# Patient Record
Sex: Female | Born: 1960 | Race: White | Hispanic: No | Marital: Married | Smoking: Never smoker
Health system: Southern US, Community
[De-identification: ages and names within clinical notes are randomized; demographics above are authoritative.]

## PROBLEM LIST (undated history)

## (undated) DIAGNOSIS — E119 Type 2 diabetes mellitus without complications: Secondary | ICD-10-CM

## (undated) HISTORY — PX: CARDIAC SURGERY: SHX584

## (undated) HISTORY — PX: KIDNEY STONE SURGERY: SHX686

---

## 2011-02-13 ENCOUNTER — Ambulatory Visit: Payer: Self-pay | Admitting: Internal Medicine

## 2018-10-16 ENCOUNTER — Inpatient Hospital Stay
Admission: EM | Admit: 2018-10-16 | Discharge: 2018-10-18 | DRG: 639 | Disposition: A | Discharge status: Home / Self Care | Payer: Managed Care, Other (non HMO) | Attending: Internal Medicine | Admitting: Internal Medicine

## 2018-10-16 ENCOUNTER — Other Ambulatory Visit: Payer: Self-pay

## 2018-10-16 ENCOUNTER — Encounter: Payer: Self-pay | Admitting: Emergency Medicine

## 2018-10-16 ENCOUNTER — Emergency Department: Payer: Managed Care, Other (non HMO)

## 2018-10-16 DIAGNOSIS — E1310 Other specified diabetes mellitus with ketoacidosis without coma: Secondary | ICD-10-CM

## 2018-10-16 DIAGNOSIS — E875 Hyperkalemia: Secondary | ICD-10-CM | POA: Diagnosis present

## 2018-10-16 DIAGNOSIS — E860 Dehydration: Secondary | ICD-10-CM | POA: Diagnosis present

## 2018-10-16 DIAGNOSIS — E878 Other disorders of electrolyte and fluid balance, not elsewhere classified: Secondary | ICD-10-CM | POA: Diagnosis present

## 2018-10-16 DIAGNOSIS — K529 Noninfective gastroenteritis and colitis, unspecified: Secondary | ICD-10-CM | POA: Diagnosis present

## 2018-10-16 DIAGNOSIS — Z888 Allergy status to other drugs, medicaments and biological substances status: Secondary | ICD-10-CM | POA: Diagnosis not present

## 2018-10-16 DIAGNOSIS — Z794 Long term (current) use of insulin: Secondary | ICD-10-CM | POA: Diagnosis not present

## 2018-10-16 DIAGNOSIS — E1110 Type 2 diabetes mellitus with ketoacidosis without coma: Secondary | ICD-10-CM

## 2018-10-16 DIAGNOSIS — E0810 Diabetes mellitus due to underlying condition with ketoacidosis without coma: Secondary | ICD-10-CM | POA: Diagnosis not present

## 2018-10-16 DIAGNOSIS — R4789 Other speech disturbances: Secondary | ICD-10-CM | POA: Diagnosis not present

## 2018-10-16 HISTORY — DX: Type 2 diabetes mellitus without complications: E11.9

## 2018-10-16 LAB — LACTIC ACID, PLASMA
Lactic Acid, Venous: 2.1 mmol/L (ref 0.5–1.9)
Lactic Acid, Venous: 0.7 mmol/L (ref 0.5–1.9)

## 2018-10-16 LAB — LIPASE, BLOOD: Lipase: 113 U/L — ABNORMAL HIGH (ref 11–51)

## 2018-10-16 LAB — BASIC METABOLIC PANEL
Anion gap: 21 — ABNORMAL HIGH (ref 5–15)
Anion gap: 7 (ref 5–15)
BUN: 28 mg/dL — ABNORMAL HIGH (ref 6–20)
BUN: 19 mg/dL (ref 6–20)
CO2: 8 mmol/L — ABNORMAL LOW (ref 22–32)
CO2: 11 mmol/L — ABNORMAL LOW (ref 22–32)
CREATININE: 0.93 mg/dL (ref 0.44–1.00)
CREATININE: 0.55 mg/dL (ref 0.44–1.00)
Calcium: 8.4 mg/dL — ABNORMAL LOW (ref 8.9–10.3)
Calcium: 7.5 mg/dL — ABNORMAL LOW (ref 8.9–10.3)
Chloride: 107 mmol/L (ref 98–111)
Chloride: 122 mmol/L — ABNORMAL HIGH (ref 98–111)
GFR calc Af Amer: >60 mL/min (ref >60)
GFR calc non Af Amer: >60 mL/min (ref >60)
Glucose, Bld: 488 mg/dL — ABNORMAL HIGH (ref 70–99)
Glucose, Bld: 191 mg/dL — ABNORMAL HIGH (ref 70–99)
Potassium: 5.6 mmol/L — ABNORMAL HIGH (ref 3.5–5.1)
Potassium: 3.8 mmol/L (ref 3.5–5.1)
Sodium: 136 mmol/L (ref 135–145)
Sodium: 140 mmol/L (ref 135–145)

## 2018-10-16 LAB — COMPREHENSIVE METABOLIC PANEL
ALBUMIN: 4.5 g/dL (ref 3.5–5.0)
ALT: 30 U/L (ref 0–44)
AST: 25 U/L (ref 15–41)
Alkaline Phosphatase: 79 U/L (ref 38–126)
Anion gap: 19 — ABNORMAL HIGH (ref 5–15)
BUN: 27 mg/dL — ABNORMAL HIGH (ref 6–20)
CALCIUM: 9.1 mg/dL (ref 8.9–10.3)
CO2: 9 mmol/L — ABNORMAL LOW (ref 22–32)
Chloride: 106 mmol/L (ref 98–111)
Creatinine, Ser: 1.08 mg/dL — ABNORMAL HIGH (ref 0.44–1.00)
GFR calc Af Amer: >60 mL/min (ref >60)
GFR calc non Af Amer: 57 mL/min — ABNORMAL LOW (ref >60)
Glucose, Bld: 497 mg/dL — ABNORMAL HIGH (ref 70–99)
Potassium: 6.7 mmol/L (ref 3.5–5.1)
SODIUM: 134 mmol/L — AB (ref 135–145)
Total Bilirubin: 1.7 mg/dL — ABNORMAL HIGH (ref 0.3–1.2)
Total Protein: 8.6 g/dL — ABNORMAL HIGH (ref 6.5–8.1)

## 2018-10-16 LAB — CBC
HCT: 52 % — ABNORMAL HIGH (ref 36.0–46.0)
Hemoglobin: 17.5 g/dL — ABNORMAL HIGH (ref 12.0–15.0)
MCH: 33 pg (ref 26.0–34.0)
MCHC: 33.7 g/dL (ref 30.0–36.0)
MCV: 97.9 fL (ref 80.0–100.0)
Platelets: 313 10*3/uL (ref 150–400)
RBC: 5.31 MIL/uL — ABNORMAL HIGH (ref 3.87–5.11)
RDW: 12.5 % (ref 11.5–15.5)
WBC: 9.9 10*3/uL (ref 4.0–10.5)
nRBC: 0 % (ref 0.0–0.2)

## 2018-10-16 LAB — GLUCOSE, CAPILLARY
GLUCOSE-CAPILLARY: 148 mg/dL — AB (ref 70–99)
GLUCOSE-CAPILLARY: 282 mg/dL — AB (ref 70–99)
GLUCOSE-CAPILLARY: 435 mg/dL — AB (ref 70–99)
Glucose-Capillary: 408 mg/dL — ABNORMAL HIGH (ref 70–99)
Glucose-Capillary: 367 mg/dL — ABNORMAL HIGH (ref 70–99)
Glucose-Capillary: 339 mg/dL — ABNORMAL HIGH (ref 70–99)
Glucose-Capillary: 183 mg/dL — ABNORMAL HIGH (ref 70–99)
Glucose-Capillary: 198 mg/dL — ABNORMAL HIGH (ref 70–99)
Glucose-Capillary: 193 mg/dL — ABNORMAL HIGH (ref 70–99)

## 2018-10-16 LAB — URINALYSIS, COMPLETE (UACMP) WITH MICROSCOPIC
BILIRUBIN URINE: NEGATIVE
Bacteria, UA: NONE SEEN
Glucose, UA: >=500 mg/dL — AB
Ketones, ur: 80 mg/dL — AB
LEUKOCYTE UA: NEGATIVE
Nitrite: NEGATIVE
Protein, ur: 30 mg/dL — AB
Specific Gravity, Urine: 1.02 (ref 1.005–1.030)
pH: 5 (ref 5.0–8.0)

## 2018-10-16 LAB — URINE DRUG SCREEN, QUALITATIVE (ARMC ONLY)
Amphetamines, Ur Screen: NOT DETECTED
BENZODIAZEPINE, UR SCRN: NOT DETECTED
Barbiturates, Ur Screen: NOT DETECTED
Cannabinoid 50 Ng, Ur ~~LOC~~: NOT DETECTED
Cocaine Metabolite,Ur ~~LOC~~: NOT DETECTED
MDMA (Ecstasy)Ur Screen: NOT DETECTED
Methadone Scn, Ur: NOT DETECTED
Opiate, Ur Screen: NOT DETECTED
Phencyclidine (PCP) Ur S: NOT DETECTED
Tricyclic, Ur Screen: NOT DETECTED

## 2018-10-16 LAB — INFLUENZA PANEL BY PCR (TYPE A & B)
Influenza A By PCR: NEGATIVE
Influenza B By PCR: NEGATIVE

## 2018-10-16 LAB — TROPONIN I: Troponin I: <0.03 ng/mL (ref <0.03)

## 2018-10-16 LAB — PHOSPHORUS: Phosphorus: <1 mg/dL — CL (ref 2.5–4.6)

## 2018-10-16 LAB — MAGNESIUM: Magnesium: 2 mg/dL (ref 1.7–2.4)

## 2018-10-16 LAB — TSH: TSH: 1.368 u[IU]/mL (ref 0.350–4.500)

## 2018-10-16 LAB — MRSA PCR SCREENING: MRSA by PCR: NEGATIVE

## 2018-10-16 LAB — BETA-HYDROXYBUTYRIC ACID: Beta-Hydroxybutyric Acid: >8 mmol/L — ABNORMAL HIGH (ref 0.05–0.27)

## 2018-10-16 MED ORDER — SODIUM CHLORIDE 0.45 % IV SOLN
INTRAVENOUS | Status: DC
  Administered 2018-10-16: 19:00:00 via INTRAVENOUS

## 2018-10-16 MED ORDER — POTASSIUM PHOSPHATES 15 MMOLE/5ML IV SOLN
30.0000 mmol | Freq: Once | INTRAVENOUS | Status: AC
  Administered 2018-10-17: 30 mmol via INTRAVENOUS
  Filled 2018-10-16: qty 10

## 2018-10-16 MED ORDER — DEXTROSE 50 % IV SOLN: 25.0000 mL | INTRAVENOUS | Status: DC | PRN

## 2018-10-16 MED ORDER — SODIUM CHLORIDE 0.9 % IV SOLN
INTRAVENOUS | Status: AC
  Administered 2018-10-16: 20:00:00 via INTRAVENOUS

## 2018-10-16 MED ORDER — POLYETHYLENE GLYCOL 3350 17 G PO PACK: 17.0000 g | PACK | Freq: Every day | ORAL | Status: DC | PRN

## 2018-10-16 MED ORDER — ALBUTEROL SULFATE (2.5 MG/3ML) 0.083% IN NEBU
2.5000 mg | INHALATION_SOLUTION | Freq: Four times a day (QID) | RESPIRATORY_TRACT | Status: DC
  Administered 2018-10-16 – 2018-10-17 (×4): 2.5 mg via RESPIRATORY_TRACT
  Filled 2018-10-16 (×4): qty 3

## 2018-10-16 MED ORDER — PROMETHAZINE HCL 25 MG/ML IJ SOLN
12.5000 mg | Freq: Once | INTRAMUSCULAR | Status: AC
  Administered 2018-10-16: 12.5 mg via INTRAVENOUS
  Filled 2018-10-16: qty 1

## 2018-10-16 MED ORDER — INSULIN REGULAR(HUMAN) IN NACL 100-0.9 UT/100ML-% IV SOLN: INTRAVENOUS | Status: DC

## 2018-10-16 MED ORDER — SODIUM CHLORIDE 0.9 % IV BOLUS
1000.0000 mL | Freq: Once | INTRAVENOUS | Status: AC
  Administered 2018-10-16: 1000 mL via INTRAVENOUS

## 2018-10-16 MED ORDER — ONDANSETRON HCL 4 MG/2ML IJ SOLN
4.0000 mg | Freq: Once | INTRAMUSCULAR | Status: AC | PRN
  Administered 2018-10-16: 4 mg via INTRAVENOUS
  Filled 2018-10-16: qty 2

## 2018-10-16 MED ORDER — ONDANSETRON HCL 4 MG PO TABS
4.0000 mg | ORAL_TABLET | Freq: Four times a day (QID) | ORAL | Status: DC | PRN
  Administered 2018-10-17 – 2018-10-18 (×2): 4 mg via ORAL
  Filled 2018-10-16 (×2): qty 1

## 2018-10-16 MED ORDER — ENOXAPARIN SODIUM 40 MG/0.4ML ~~LOC~~ SOLN
40.0000 mg | SUBCUTANEOUS | Status: DC
  Administered 2018-10-16 – 2018-10-17 (×2): 40 mg via SUBCUTANEOUS
  Filled 2018-10-16 (×2): qty 0.4

## 2018-10-16 MED ORDER — INSULIN ASPART 100 UNIT/ML ~~LOC~~ SOLN
10.0000 [IU] | Freq: Once | SUBCUTANEOUS | Status: AC
  Administered 2018-10-16: 10 [IU] via INTRAVENOUS
  Filled 2018-10-16: qty 1

## 2018-10-16 MED ORDER — INSULIN REGULAR BOLUS VIA INFUSION
0.0000 [IU] | Freq: Three times a day (TID) | INTRAVENOUS | Status: DC
  Filled 2018-10-16: qty 10

## 2018-10-16 MED ORDER — DEXTROSE-NACL 5-0.45 % IV SOLN: INTRAVENOUS | Status: DC

## 2018-10-16 MED ORDER — SODIUM CHLORIDE 0.9% FLUSH: 3.0000 mL | Freq: Once | INTRAVENOUS | Status: DC

## 2018-10-16 MED ORDER — K PHOS MONO-SOD PHOS DI & MONO 155-852-130 MG PO TABS
500.0000 mg | ORAL_TABLET | ORAL | Status: AC
  Administered 2018-10-17: 500 mg via ORAL
  Filled 2018-10-16 (×3): qty 2

## 2018-10-16 MED ORDER — ACETAMINOPHEN 650 MG RE SUPP: 650.0000 mg | Freq: Four times a day (QID) | RECTAL | Status: DC | PRN

## 2018-10-16 MED ORDER — ONDANSETRON HCL 4 MG/2ML IJ SOLN
4.0000 mg | Freq: Four times a day (QID) | INTRAMUSCULAR | Status: DC | PRN
  Administered 2018-10-17: 4 mg via INTRAVENOUS
  Filled 2018-10-16: qty 2

## 2018-10-16 MED ORDER — CALCIUM GLUCONATE-NACL 1-0.675 GM/50ML-% IV SOLN
1.0000 g | Freq: Once | INTRAVENOUS | Status: AC
  Administered 2018-10-16: 1000 mg via INTRAVENOUS
  Filled 2018-10-16: qty 50

## 2018-10-16 MED ORDER — INSULIN REGULAR(HUMAN) IN NACL 100-0.9 UT/100ML-% IV SOLN
INTRAVENOUS | Status: DC
  Administered 2018-10-16: 3.1 [IU]/h via INTRAVENOUS
  Filled 2018-10-16: qty 100

## 2018-10-16 MED ORDER — ACETAMINOPHEN 325 MG PO TABS: 650.0000 mg | ORAL_TABLET | Freq: Four times a day (QID) | ORAL | Status: DC | PRN

## 2018-10-16 MED ORDER — INSULIN GLARGINE 100 UNIT/ML ~~LOC~~ SOLN
10.0000 [IU] | SUBCUTANEOUS | Status: DC
  Administered 2018-10-17: 10 [IU] via SUBCUTANEOUS
  Filled 2018-10-16 (×2): qty 0.1

## 2018-10-16 MED ORDER — INSULIN ASPART 100 UNIT/ML ~~LOC~~ SOLN
0.0000 [IU] | SUBCUTANEOUS | Status: DC
  Administered 2018-10-17 (×2): 7 [IU] via SUBCUTANEOUS
  Filled 2018-10-16: qty 1

## 2018-10-16 MED ORDER — SODIUM CHLORIDE 0.9 % IV SOLN: INTRAVENOUS | Status: DC

## 2018-10-16 MED ORDER — SODIUM CHLORIDE 0.9 % IV SOLN
1.0000 g | Freq: Once | INTRAVENOUS | Status: DC
  Filled 2018-10-16: qty 10

## 2018-10-16 MED ORDER — DEXTROSE-NACL 5-0.45 % IV SOLN
INTRAVENOUS | Status: DC
  Administered 2018-10-16: 21:00:00 via INTRAVENOUS

## 2018-10-16 NOTE — H&P (Addendum)
[facility] - [place] at [facility]   PATIENT NAME: Judith Rogers    MR#:  [id number]  DATE OF BIRTH:  Jan 14, 1961  DATE OF ADMISSION:  10/16/2018  PRIMARY CARE PHYSICIAN: Marguarite Arbour, MD   REQUESTING/REFERRING PHYSICIAN:   CHIEF COMPLAINT:   Chief Complaint  Patient presents with  . Emesis  . Weakness    HISTORY OF PRESENT ILLNESS: Judith Rogers  is a 58 y.o. female with a known history per below presents emergency room with 3-day history of worsening nausea, emesis, fatigue, generalized weakness, not able to eat over the last several days, in the emergency room patient was found to have potassium 6.7, glucose 497, anion gap 19, hemoglobin 17, bicarb 9, patient valuated in the emergency room, multiple family was present, patient states that she did not take her medications for her diabetes due to inability to tolerate p.o., patient is now admitted for acute diabetic ketoacidosis with hyperkalemia, dehydration, emesis.  PAST MEDICAL HISTORY:   Past Medical History:  Diagnosis Date  . Diabetes mellitus without complication (HCC)     PAST SURGICAL HISTORY:  None  SOCIAL HISTORY:  Social History   Tobacco Use  . Smoking status: Not on file  Substance Use Topics  . Alcohol use: Not on file    FAMILY HISTORY:  HTN  DRUG ALLERGIES:  Allergies  Allergen Reactions  . Aspirin Other (See Comments)    Gi bleeding   . Vioxx [Rofecoxib] Other (See Comments)    Gi bleeding    REVIEW OF SYSTEMS:   CONSTITUTIONAL: No fever, fatigue or weakness.  EYES: No blurred or double vision.  EARS, NOSE, AND THROAT: No tinnitus or ear pain.  RESPIRATORY: No cough, shortness of breath, wheezing or hemoptysis.  CARDIOVASCULAR: No chest pain, orthopnea, edema.  GASTROINTESTINAL: No nausea, vomiting, diarrhea or abdominal pain.  GENITOURINARY: No dysuria, hematuria.  ENDOCRINE: No polyuria, nocturia,  HEMATOLOGY: No anemia, easy bruising or bleeding SKIN: No rash or  lesion. MUSCULOSKELETAL: No joint pain or arthritis.   NEUROLOGIC: No tingling, numbness, weakness.  PSYCHIATRY: No anxiety or depression.   MEDICATIONS AT HOME:  Prior to Admission medications   Medication Sig Start Date End Date Taking? Authorizing Provider  enalapril (VASOTEC) 2.5 MG tablet Take 2.5 mg by mouth daily. 05/01/17  Yes [provider]  Insulin Detemir (LEVEMIR FLEXTOUCH) 100 UNIT/ML Pen Inject 28 Units into the skin Nightly. 04/03/17  Yes [provider]  insulin glargine (LANTUS) 100 UNIT/ML injection Inject 28 Units into the skin daily.   Yes [provider]  insulin lispro (HUMALOG) 100 UNIT/ML KwikPen Inject 50 Units into the skin 2 (two) times daily. 12/19/16  Yes [provider]  metFORMIN (GLUCOPHAGE-XR) 500 MG 24 hr tablet Take 500 mg by mouth daily. 09/02/18  Yes [provider]  Semaglutide,0.25 or 0.5MG /DOS, 2 MG/1.5ML SOPN Inject 0.5 mg into the skin every 7 (seven) days. 09/02/18  Yes [provider]      PHYSICAL EXAMINATION:   VITAL SIGNS: Blood pressure 138/82, pulse (!) 115, temperature (!) 97.4 F (36.3 C), temperature source Oral, resp. rate 18, height 5\' 6"  (1.676 m), weight 74.4 kg, SpO2 99 %.  GENERAL:  58 y.o.-year-old patient lying in the bed with no acute distress.  EYES: Pupils equal, round, reactive to light and accommodation. No scleral icterus. Extraocular muscles intact.  HEENT: Head atraumatic, normocephalic. Oropharynx and nasopharynx clear.  NECK:  Supple, no jugular venous distention. No thyroid enlargement, no tenderness.  LUNGS:  Normal breath sounds bilaterally, no wheezing, rales,rhonchi or crepitation. No use of accessory muscles of respiration.  CARDIOVASCULAR: S1, S2 normal. No murmurs, rubs, or gallops.  ABDOMEN: Soft, nontender, nondistended. Bowel sounds present. No organomegaly or mass.  EXTREMITIES: No pedal edema, cyanosis, or clubbing.  NEUROLOGIC: Cranial nerves II through XII  are intact. Muscle strength 5/5 in all extremities. Sensation intact. Gait not checked.  PSYCHIATRIC: The patient is alert and oriented x 3.  SKIN: No obvious rash, lesion, or ulcer.   LABORATORY PANEL:   CBC Recent Labs  Lab 10/16/18 1424  WBC 9.9  HGB 17.5*  HCT 52.0*  PLT 313  MCV 97.9  MCH 33.0  MCHC 33.7  RDW 12.5   ------------------------------------------------------------------------------------------------------------------  Chemistries  Recent Labs  Lab 10/16/18 1424  NA 134*  K 6.7*  CL 106  CO2 9*  GLUCOSE 497*  BUN 27*  CREATININE 1.08*  CALCIUM 9.1  AST 25  ALT 30  ALKPHOS 79  BILITOT 1.7*   ------------------------------------------------------------------------------------------------------------------ estimated creatinine clearance is 58.5 mL/min (A) (by C-G formula based on SCr of 1.08 mg/dL (H)). ------------------------------------------------------------------------------------------------------------------ No results for input(s): TSH, T4TOTAL, T3FREE, THYROIDAB in the last 72 hours.  Invalid input(s): FREET3   Coagulation profile No results for input(s): INR, PROTIME in the last 168 hours. ------------------------------------------------------------------------------------------------------------------- No results for input(s): DDIMER in the last 72 hours. -------------------------------------------------------------------------------------------------------------------  Cardiac Enzymes No results for input(s): CKMB, TROPONINI, MYOGLOBIN in the last 168 hours.  Invalid input(s): CK ------------------------------------------------------------------------------------------------------------------ Invalid input(s): POCBNP  ---------------------------------------------------------------------------------------------------------------  Urinalysis No results found for: COLORURINE, APPEARANCEUR, LABSPEC, PHURINE, GLUCOSEU, HGBUR,  BILIRUBINUR, KETONESUR, PROTEINUR, UROBILINOGEN, NITRITE, LEUKOCYTESUR   RADIOLOGY: No results found.  EKG: Orders placed or performed during the hospital encounter of 10/16/18  . EKG 12-Lead  . EKG 12-Lead  . ED EKG  . ED EKG    IMPRESSION AND PLAN: *Acute diabetic ketoacidosis, mild Admit to stepdown unit on our DKA protocol, IV fluids for rehydration, and continue close medical monitoring  *Chronic type II diabetes mellitus type 2 with hyperglycemia Plan of care as stated above  *Acute nausea/emesis Secondary to #1 Antiemetics PRN  *Acute hyperkalemia Treated with calcium gluconate, sodium bicarb, IV insulin-currently on insulin drip, BMP per protocol, IV fluids, and continue close medical monitoring  All the records are reviewed and case discussed with ED provider. Management plans discussed with the patient, family and they are in agreement.  CODE STATUS:full   TOTAL TIME TAKING CARE OF THIS PATIENT: 45 minutes.    Evelena Asa Mariesha Venturella M.D on 10/16/2018   Between 7am to 6pm - Pager - [phone]  After 6pm go to [email or web address] - password [email or web address]  [facility]  Office  [phone]  CC: Primary care physician; Marguarite Arbour, MD   Note: This dictation was prepared with Dragon dictation along with smaller phrase technology. Any transcriptional errors that result from this process are unintentional.

## 2018-10-16 NOTE — ED Triage Notes (Signed)
Nausea and vomiting x 3 days. States general weakness and multiple syncopal episodes during this time.

## 2018-10-16 NOTE — Progress Notes (Signed)
Family Meeting Note  Advance Directive:yes  Today a meeting took place with the Patient.  Patient is able to participate  The following clinical team members were present during this meeting:MD  The following were discussed:Patient's diagnosis: DKA, Patient's progosis: Unable to determine and Goals for treatment: Full Code  Additional follow-up to be provided: prn  Time spent during discussion:20 minutes  Bertrum Sol, MD

## 2018-10-16 NOTE — ED Provider Notes (Addendum)
[facility] Emergency Department Provider Note  ____________________________________________   I have reviewed the triage vital signs and the nursing notes. Where available I have reviewed prior notes and, if possible and indicated, outside hospital notes.    HISTORY  Chief Complaint Emesis and Weakness    HPI Judith Rogers is a 58 y.o. female with history of diabetes mellitus presents today complaining of feeling weak.  She states that she has had fever, vomiting, and rhinorrhea cough and other flulike symptoms since Wednesday.  She is supposed to take insulin however because she has not been eating she did not take her insulin.  She states her weakness is progressed she has been very lightheaded multiple times felt like she might pass out.  No injury.  No seizure activity.  Has diffuse abdominal discomfort but no focal pain.  She denies melena bright red blood per rectum or hematemesis she denies any diarrhea.  Decreased stooling in the context of this disease.  She has had positive sick contacts and very large flu exposure in her community.  Patient states she did get a flu shot.   Past Medical History:  Diagnosis Date  . Diabetes mellitus without complication (HCC)     There are no active problems to display for this patient.   History reviewed. No pertinent surgical history.  Prior to Admission medications   Not on File    Allergies Aspirin and Vioxx [rofecoxib]  No family history on file.  Social History Social History   Tobacco Use  . Smoking status: Not on file  Substance Use Topics  . Alcohol use: Not on file  . Drug use: Not on file    Review of Systems Constitutional: + fever/chills Eyes: No visual changes. ENT: No sore throat. No stiff neck no neck pain Cardiovascular: Denies chest pain. + cough Respiratory: Denies shortness of breath. + :   no vomiting.  No diarrhea.  No constipation. Genitourinary: Negative for  dysuria. Musculoskeletal: Negative lower extremity swelling Skin: Negative for rash. Neurological: Negative for severe headaches, focal weakness or numbness.   ____________________________________________   PHYSICAL EXAM:  VITAL SIGNS: ED Triage Vitals  Enc Vitals Group     BP 10/16/18 1404 138/82     Pulse Rate 10/16/18 1404 (!) 115     Resp 10/16/18 1404 18     Temp 10/16/18 1404 (!) 97.4 F (36.3 C)     Temp Source 10/16/18 1404 Oral     SpO2 10/16/18 1404 99 %     Weight 10/16/18 1418 164 lb (74.4 kg)     Height 10/16/18 1418 5\' 6"  (1.676 m)     Head Circumference --      Peak Flow --      Pain Score 10/16/18 1418 5     Pain Loc --      Pain Edu? --      Excl. in GC? --     Constitutional: Alert and oriented.  Eyes: Conjunctivae are normal Head: Atraumatic HEENT: + congestion/rhinnorhea. Mucous membranes are moist.  Oropharynx non-erythematous Neck:   Nontender with no meningismus, no masses, no stridor Cardiovascular: Tachycardia noted regular rhythm. Grossly normal heart sounds.  Good peripheral circulation. Respiratory: Normal respiratory effort.  No retractions. Lungs CTAB. Abdominal: Soft and mild diffuse discomfort surgical. No distention. No guarding no rebound Back:  There is no focal tenderness or step off.  there is no midline tenderness there are no lesions noted. there is no CVA tenderness Musculoskeletal: No  lower extremity tenderness, no upper extremity tenderness. No joint effusions, no DVT signs strong distal pulses no edema Neurologic:  Normal speech and language. No gross focal neurologic deficits are appreciated.  Skin:  Skin is warm, dry and intact. No rash noted. Psychiatric: Mood and affect are anxious. Speech and behavior are normal.  ____________________________________________   LABS (all labs ordered are listed, but only abnormal results are displayed)  Labs Reviewed  GLUCOSE, CAPILLARY - Abnormal; Notable for the following components:       Result Value   Glucose-Capillary 435 (*)    All other components within normal limits  LIPASE, BLOOD - Abnormal; Notable for the following components:   Lipase 113 (*)    All other components within normal limits  COMPREHENSIVE METABOLIC PANEL - Abnormal; Notable for the following components:   Sodium 134 (*)    Potassium 6.7 (*)    CO2 9 (*)    Glucose, Bld 497 (*)    BUN 27 (*)    Creatinine, Ser 1.08 (*)    Total Protein 8.6 (*)    Total Bilirubin 1.7 (*)    GFR calc non Af Amer 57 (*)    Anion gap 19 (*)    All other components within normal limits  CBC - Abnormal; Notable for the following components:   RBC 5.31 (*)    Hemoglobin 17.5 (*)    HCT 52.0 (*)    All other components within normal limits  URINALYSIS, COMPLETE (UACMP) WITH MICROSCOPIC  BASIC METABOLIC PANEL  BETA-HYDROXYBUTYRIC ACID  INFLUENZA PANEL BY PCR (TYPE A & B)    Pertinent labs  results that were available during my care of the patient were reviewed by me and considered in my medical decision making (see chart for details). ____________________________________________  EKG  I personally interpreted any EKGs ordered by me or triage Minus tach rate 121 no acute ST elevation or depression nonspecific ST changes no peak T waves necessarily noted, ____________________________________________  RADIOLOGY  Pertinent labs & imaging results that were available during my care of the patient were reviewed by me and considered in my medical decision making (see chart for details). If possible, patient and/or family made aware of any abnormal findings.  No results found. ____________________________________________    PROCEDURES  Procedure(s) performed: None  Procedures  Critical Care performed: CRITICAL CARE Performed by: Jeanmarie PlantJAMES A    Total critical care time: 36 minutes  Critical care time was exclusive of separately billable procedures and treating other patients.  Critical care was  necessary to treat or prevent imminent or life-threatening deterioration.  Critical care was time spent personally by me on the following activities: development of treatment plan with patient and/or surrogate as well as nursing, discussions with consultants, evaluation of patient's response to treatment, examination of patient, obtaining history from patient or surrogate, ordering and performing treatments and interventions, ordering and review of laboratory studies, ordering and review of radiographic studies, pulse oximetry and re-evaluation of patient's condition.   ____________________________________________   INITIAL IMPRESSION / ASSESSMENT AND PLAN / ED COURSE  Pertinent labs & imaging results that were available during my care of the patient were reviewed by me and considered in my medical decision making (see chart for details).  Patient here with nausea vomiting and fever URI symptoms and flu symptoms.  Abdomen is a little tender but nonsurgical but after 4 days I take this to be very positive sign.  She has evidence however of DKA with an anion gap  and elevated sugars after not taking her sling during the course of this illness.  Her last visit to her doctor early January was a visit for "uncontrolled type 2 diabetes".  Her hemoglobin A1c was 9.1 when last checked before that visit.  Patient however does have an anion gap.  Her potassium is elevated here, but her renal function is good, I am concerned about this but I do not see any acute EKG changes, we will give her insulin to start removing potassium from the bloodstream and we will give her calcium to stabilize her myocardium however I am very reluctant to be aggressive with potassium reduction because if this is DKA which appears to be her whole body potassium is likely depleted and we could cause her some serious trouble if we are too aggressive.  Given that I do not see any significant EKG changes we will take this stepwise approach I  am giving her 2 L of fluid, I am also rechecking her potassium because of the fact that her kidney function is good and that is a pretty high potassium.   ____________________________________________   FINAL CLINICAL IMPRESSION(S) / ED DIAGNOSES  Final diagnoses:  None      This chart was dictated using voice recognition software.  Despite best efforts to proofread,  errors can occur which can change meaning.       Jeanmarie Plant, MD 10/16/18 1521    Jeanmarie Plant, MD 11/12/18 [phone]

## 2018-10-16 NOTE — Consult Note (Signed)
PULMONARY / CRITICAL CARE MEDICINE  Name: Judith Rogers I Dames MRN: [id number] DOB: 11/22/1960    LOS: 0  Referring Provider: Dr. Katheren ShamsSalary Reason for Referral: DKA, intractable nausea and vomiting  HPI: 58 year old female with a history of diabetes admitted with complaints of nausea, vomiting, fever, cough, chills and generalized weakness that started on Wednesday.  States that symptoms progressively got worse hence she was not able to eat and keep down any food and hence did not take her insulin.  At the ED, she was found to be in severe hypoglycemia with a blood glucose of 497 mg/dL, potassium of 6.7 and BUN of 27.  She was started on an insulin infusion and admitted to the ICU.  She reports some improvement with IV fluids and insulin.  She denies chest pain, palpitations, but reports dizziness and headache.  Past Medical History:  Diagnosis Date  . Diabetes mellitus without complication (HCC)    History reviewed. No pertinent surgical history. Prior to Admission medications   Medication Sig Start Date End Date Taking? Authorizing Provider  amLODipine (NORVASC) 5 MG tablet Take 5 mg by mouth daily.   Yes [provider]  clopidogrel (PLAVIX) 75 MG tablet Take 75 mg by mouth daily.   Yes [provider]  donepezil (ARICEPT) 5 MG tablet Take 1 tablet (5 mg total) by mouth at bedtime. 04/26/18 06/05/18 Yes Sowles, Danna HeftyKrichna, MD  empagliflozin (JARDIANCE) 25 MG TABS tablet Take 25 mg by mouth daily.   Yes [provider]  glycopyrrolate (ROBINUL) 1 MG tablet Take 1 mg by mouth 2 (two) times daily.   Yes [provider]  insulin aspart (NOVOLOG FLEXPEN) 100 UNIT/ML FlexPen Inject 12 Units into the skin 2 (two) times daily.   Yes [provider]  insulin aspart (NOVOLOG) 100 UNIT/ML FlexPen Inject 18 Units into the skin daily. At 1700   Yes [provider]  Insulin Degludec-Liraglutide (XULTOPHY) 100-3.6 UNIT-MG/ML SOPN Inject 50 Units into the skin  daily.   Yes [provider]  levETIRAcetam (KEPPRA) 500 MG tablet Take 500 mg by mouth 2 (two) times daily.   Yes [provider]  lipase/protease/amylase (CREON) 12000 units CPEP capsule Take 6,000 Units by mouth 3 (three) times daily before meals.   Yes [provider]  lipase/protease/amylase (CREON) 12000 units CPEP capsule Take 3,000 Units by mouth at bedtime. With snack   Yes [provider]  lisinopril (PRINIVIL,ZESTRIL) 5 MG tablet Take 5 mg by mouth daily.   Yes [provider]  metoprolol succinate (TOPROL-XL) 25 MG 24 hr tablet Take 1 tablet (25 mg total) by mouth daily. 04/26/18  Yes Sowles, Danna HeftyKrichna, MD  rosuvastatin (CRESTOR) 40 MG tablet Take 1 tablet (40 mg total) by mouth daily. 04/26/18 06/05/18 Yes Alba CorySowles, Krichna, MD  aspirin EC 81 MG tablet Take 81 mg by mouth daily.    [provider]  famotidine (PEPCID) 20 MG tablet Take 1 tablet (20 mg total) by mouth 2 (two) times daily. 04/26/18 05/26/18  Alba CorySowles, Krichna, MD  gabapentin (NEURONTIN) 300 MG capsule Take 1 capsule (300 mg total) by mouth 2 (two) times daily. 04/26/18 05/26/18  Alba CorySowles, Krichna, MD  insulin glargine (LANTUS) 100 UNIT/ML injection Inject 0.1 mLs (10 Units total) into the skin daily. 04/26/18 05/26/18  Alba CorySowles, Krichna, MD  lacosamide 100 MG TABS Take 1 tablet (100 mg total) by mouth 2 (two) times daily. Patient not taking: Reported on 06/05/2018 09/11/17   Enedina FinnerPatel, Sona, MD  promethazine (PHENERGAN) 12.5 MG tablet  Take 1 tablet (12.5 mg total) by mouth every 6 (six) hours as needed for nausea or vomiting. Patient not taking: Reported on 06/05/2018 06/30/17   Almond Lint, MD  sertraline (ZOLOFT) 25 MG tablet Take 1 tablet (25 mg total) by mouth daily. Patient not taking: Reported on 06/05/2018 04/26/18   Alba Cory, MD   Allergies Allergies  Allergen Reactions  . Aspirin Other (See Comments)    Gi bleeding   . Vioxx [Rofecoxib] Other (See Comments)    Gi bleeding     Family History No family history on file. Social History  has no history on file for tobacco, alcohol, and drug.  Review Of Systems: All systems reviewed.  Pertinent positives include generalized malaise, abdominal pain, nausea, vomiting, dizziness and headache.  All other systems are negative  VITAL SIGNS: BP 136/63   Pulse 94   Temp 98.4 F (36.9 C) (Oral)   Resp (!) 22   Ht 5\' 6"  (1.676 m)   Wt 71.6 kg   SpO2 98%   BMI 25.48 kg/m   HEMODYNAMICS:    VENTILATOR SETTINGS:    INTAKE / OUTPUT: No intake/output data recorded.  PHYSICAL EXAMINATION: General: Acutely ill looking HEENT: PERRLA Neuro: Alert and oriented x3, no focal deficits Cardiovascular: RRR, S1-S2, no murmur regurg or gallop Lungs: Clear to auscultation bilaterally Abdomen: Distended, positive bowel sounds, diffuse tenderness with gentle palpation Musculoskeletal: Positive range of motion Skin: Warm and dry  LABS:  BMET Recent Labs  Lab 10/16/18 1424 10/16/18 1517  NA 134* 136  K 6.7* 5.6*  CL 106 107  CO2 9* 8*  BUN 27* 28*  CREATININE 1.08* 0.93  GLUCOSE 497* 488*    Electrolytes Recent Labs  Lab 10/16/18 1424 10/16/18 1517  CALCIUM 9.1 8.4*    CBC Recent Labs  Lab 10/16/18 1424  WBC 9.9  HGB 17.5*  HCT 52.0*  PLT 313    Coag's No results for input(s): APTT, INR in the last 168 hours.  Sepsis Markers Recent Labs  Lab 10/16/18 1902  LATICACIDVEN 2.1*    ABG No results for input(s): PHART, PCO2ART, PO2ART in the last 168 hours.  Liver Enzymes Recent Labs  Lab 10/16/18 1424  AST 25  ALT 30  ALKPHOS 79  BILITOT 1.7*  ALBUMIN 4.5    Cardiac Enzymes No results for input(s): TROPONINI, PROBNP in the last 168 hours.  Glucose Recent Labs  Lab 10/16/18 1612 10/16/18 1713 10/16/18 1825 10/16/18 1925 10/16/18 2031 10/16/18 2129  GLUCAP 408* 367* 339* 282* 198* 183*    Imaging Dg Abdomen Acute W/chest  Result Date: 10/16/2018 CLINICAL DATA:   Vomiting, nausea, syncopal episodes, symptoms for 3 days, history diabetes mellitus EXAM: DG ABDOMEN ACUTE W/ 1V CHEST COMPARISON:  None FINDINGS: Normal heart size, mediastinal contours, and pulmonary vascularity. Surgical clips at inferior mediastinum. Emphysematous changes without pulmonary infiltrate, pleural effusion or pneumothorax. Bones demineralized. Bowel gas pattern normal. No bowel dilatation, bowel wall thickening, or free air. No urinary tract calcification. IMPRESSION: Emphysematous changes without infiltrate. No acute abdominal findings. Electronically Signed   By: Ulyses Southward M.D.   On: 10/16/2018 16:14   ASSESSMENT  DKA Hyperkalemia Nausea and vomiting Type 2 diabetes Acute renal failure  PLAN DKA protocol Monitor and correct electrolyte imbalances Zofran and Phenergan for nausea and vomiting Consult to diabetes educator Hemoglobin A1c with a.m. labs Trend creatinine and potassium level  Best Practice: Code Status: Full code Diet: Clear liquids GI prophylaxis: Not indicated VTE prophylaxis: SCDs and  Lovenox  FAMILY  - Updates: Patient updated on current treatment plan at bedside. no family at bedside.  Will update when available  Dalylah Ramey S. Tukov-Yual, ANP-BC Pulmonary and Critical Care Medicine [facility] Pager [phone] or [phone]  NB: This document was prepared using Dragon voice recognition software and may include unintentional dictation errors.    10/16/2018, 9:42 PM

## 2018-10-16 NOTE — Progress Notes (Deleted)
Patient now requesting TPA given CT Angie of the head/neck findings and worsening problems with speech.  Will transfer to stepdown unit for further evaluation.

## 2018-10-17 LAB — BASIC METABOLIC PANEL
ANION GAP: 4 — AB (ref 5–15)
ANION GAP: 5 (ref 5–15)
Anion gap: 7 (ref 5–15)
BUN: 19 mg/dL (ref 6–20)
BUN: 18 mg/dL (ref 6–20)
BUN: 15 mg/dL (ref 6–20)
CALCIUM: 7.5 mg/dL — AB (ref 8.9–10.3)
CALCIUM: 7.6 mg/dL — AB (ref 8.9–10.3)
CALCIUM: 8 mg/dL — AB (ref 8.9–10.3)
CO2: 15 mmol/L — ABNORMAL LOW (ref 22–32)
CO2: 16 mmol/L — ABNORMAL LOW (ref 22–32)
CO2: 17 mmol/L — ABNORMAL LOW (ref 22–32)
CREATININE: 0.58 mg/dL (ref 0.44–1.00)
Chloride: 118 mmol/L — ABNORMAL HIGH (ref 98–111)
Chloride: 115 mmol/L — ABNORMAL HIGH (ref 98–111)
Chloride: 113 mmol/L — ABNORMAL HIGH (ref 98–111)
Creatinine, Ser: 0.54 mg/dL (ref 0.44–1.00)
Creatinine, Ser: 0.53 mg/dL (ref 0.44–1.00)
GFR calc Af Amer: >60 mL/min (ref >60)
GFR calc Af Amer: >60 mL/min (ref >60)
GFR calc Af Amer: >60 mL/min (ref >60)
GFR calc non Af Amer: >60 mL/min (ref >60)
GFR calc non Af Amer: >60 mL/min (ref >60)
GFR calc non Af Amer: >60 mL/min (ref >60)
Glucose, Bld: 169 mg/dL — ABNORMAL HIGH (ref 70–99)
Glucose, Bld: 228 mg/dL — ABNORMAL HIGH (ref 70–99)
Glucose, Bld: 249 mg/dL — ABNORMAL HIGH (ref 70–99)
Potassium: 3.9 mmol/L (ref 3.5–5.1)
Potassium: 3.7 mmol/L (ref 3.5–5.1)
Potassium: 3.3 mmol/L — ABNORMAL LOW (ref 3.5–5.1)
Sodium: 137 mmol/L (ref 135–145)
Sodium: 136 mmol/L (ref 135–145)
Sodium: 137 mmol/L (ref 135–145)

## 2018-10-17 LAB — CBC WITH DIFFERENTIAL/PLATELET
ABS IMMATURE GRANULOCYTES: 0.03 10*3/uL (ref 0.00–0.07)
Basophils Absolute: 0 10*3/uL (ref 0.0–0.1)
Basophils Relative: 0 %
Eosinophils Absolute: 0 10*3/uL (ref 0.0–0.5)
Eosinophils Relative: 0 %
HCT: 35.6 % — ABNORMAL LOW (ref 36.0–46.0)
Hemoglobin: 12.4 g/dL (ref 12.0–15.0)
Immature Granulocytes: 0 %
Lymphocytes Relative: 26 %
Lymphs Abs: 2.1 10*3/uL (ref 0.7–4.0)
MCH: 33 pg (ref 26.0–34.0)
MCHC: 34.8 g/dL (ref 30.0–36.0)
MCV: 94.7 fL (ref 80.0–100.0)
Monocytes Absolute: 0.7 10*3/uL (ref 0.1–1.0)
Monocytes Relative: 8 %
NEUTROS ABS: 5 10*3/uL (ref 1.7–7.7)
NRBC: 0 % (ref 0.0–0.2)
Neutrophils Relative %: 66 %
Platelets: 231 10*3/uL (ref 150–400)
RBC: 3.76 MIL/uL — ABNORMAL LOW (ref 3.87–5.11)
RDW: 12.6 % (ref 11.5–15.5)
WBC: 7.8 10*3/uL (ref 4.0–10.5)

## 2018-10-17 LAB — GLUCOSE, CAPILLARY
GLUCOSE-CAPILLARY: 144 mg/dL — AB (ref 70–99)
GLUCOSE-CAPILLARY: 264 mg/dL — AB (ref 70–99)
GLUCOSE-CAPILLARY: 281 mg/dL — AB (ref 70–99)
Glucose-Capillary: 142 mg/dL — ABNORMAL HIGH (ref 70–99)
Glucose-Capillary: 156 mg/dL — ABNORMAL HIGH (ref 70–99)
Glucose-Capillary: 220 mg/dL — ABNORMAL HIGH (ref 70–99)
Glucose-Capillary: 223 mg/dL — ABNORMAL HIGH (ref 70–99)
Glucose-Capillary: 242 mg/dL — ABNORMAL HIGH (ref 70–99)

## 2018-10-17 LAB — TROPONIN I
Troponin I: <0.03 ng/mL (ref <0.03)
Troponin I: <0.03 ng/mL (ref <0.03)

## 2018-10-17 LAB — PHOSPHORUS: Phosphorus: 1 mg/dL — CL (ref 2.5–4.6)

## 2018-10-17 LAB — LIPASE, BLOOD: Lipase: 78 U/L — ABNORMAL HIGH (ref 11–51)

## 2018-10-17 MED ORDER — ALBUTEROL SULFATE (2.5 MG/3ML) 0.083% IN NEBU: 2.5000 mg | INHALATION_SOLUTION | Freq: Four times a day (QID) | RESPIRATORY_TRACT | Status: DC | PRN

## 2018-10-17 MED ORDER — NON FORMULARY: 0.5000 mg | Freq: Once | Status: DC

## 2018-10-17 MED ORDER — POTASSIUM CHLORIDE IN NACL 20-0.45 MEQ/L-% IV SOLN
INTRAVENOUS | Status: DC
  Administered 2018-10-17: 09:00:00 via INTRAVENOUS
  Filled 2018-10-17 (×2): qty 1000

## 2018-10-17 MED ORDER — INSULIN ASPART 100 UNIT/ML ~~LOC~~ SOLN
0.0000 [IU] | Freq: Every day | SUBCUTANEOUS | Status: DC
  Administered 2018-10-17: 2 [IU] via SUBCUTANEOUS
  Filled 2018-10-17: qty 1

## 2018-10-17 MED ORDER — K PHOS MONO-SOD PHOS DI & MONO 155-852-130 MG PO TABS
500.0000 mg | ORAL_TABLET | ORAL | Status: AC
  Administered 2018-10-17 (×3): 500 mg via ORAL
  Filled 2018-10-17 (×3): qty 2

## 2018-10-17 MED ORDER — METOCLOPRAMIDE HCL 5 MG/ML IJ SOLN
5.0000 mg | Freq: Four times a day (QID) | INTRAMUSCULAR | Status: AC
  Administered 2018-10-17 – 2018-10-18 (×4): 5 mg via INTRAVENOUS
  Filled 2018-10-17 (×4): qty 2

## 2018-10-17 MED ORDER — INSULIN GLARGINE 100 UNIT/ML ~~LOC~~ SOLN
25.0000 [IU] | Freq: Every day | SUBCUTANEOUS | Status: DC
  Administered 2018-10-17: 25 [IU] via SUBCUTANEOUS
  Filled 2018-10-17 (×2): qty 0.25

## 2018-10-17 MED ORDER — INSULIN GLARGINE 100 UNIT/ML ~~LOC~~ SOLN: 20.0000 [IU] | Freq: Every day | SUBCUTANEOUS | Status: DC

## 2018-10-17 MED ORDER — SEMAGLUTIDE(0.25 OR 0.5MG/DOS) 2 MG/1.5ML ~~LOC~~ SOPN
0.5000 mg | PEN_INJECTOR | Freq: Once | SUBCUTANEOUS | Status: AC
  Administered 2018-10-17: 0.5 mg via SUBCUTANEOUS
  Filled 2018-10-17: qty 1

## 2018-10-17 MED ORDER — INSULIN ASPART 100 UNIT/ML ~~LOC~~ SOLN
0.0000 [IU] | Freq: Three times a day (TID) | SUBCUTANEOUS | Status: DC
  Administered 2018-10-17 – 2018-10-18 (×3): 8 [IU] via SUBCUTANEOUS
  Filled 2018-10-17 (×3): qty 1

## 2018-10-17 MED ORDER — INSULIN GLARGINE 100 UNIT/ML ~~LOC~~ SOLN
10.0000 [IU] | Freq: Once | SUBCUTANEOUS | Status: AC
  Administered 2018-10-17: 10 [IU] via SUBCUTANEOUS
  Filled 2018-10-17: qty 0.1

## 2018-10-17 MED ORDER — LACTATED RINGERS IV SOLN
INTRAVENOUS | Status: DC
  Administered 2018-10-17: via INTRAVENOUS

## 2018-10-17 MED ORDER — POTASSIUM CHLORIDE CRYS ER 20 MEQ PO TBCR
40.0000 meq | EXTENDED_RELEASE_TABLET | Freq: Once | ORAL | Status: AC
  Administered 2018-10-17: 40 meq via ORAL
  Filled 2018-10-17: qty 2

## 2018-10-17 NOTE — Progress Notes (Signed)
Lab reported critical phosphorous level, 1.0. MD notified. Order obtained to reinstate PO replacement X 3 doses. Order entered.

## 2018-10-17 NOTE — Progress Notes (Signed)
[facility] - [place] at [facility]   PATIENT NAME: Judith Rogers    MR#:  [id number]  DATE OF BIRTH:  31-Jul-1961  SUBJECTIVE:  CHIEF COMPLAINT:   Chief Complaint  Patient presents with  . Emesis  . Weakness   Still has nausea but no vomiting. BS improved Off insulin drip and transferred out of ICU  REVIEW OF SYSTEMS:    Review of Systems  Constitutional: Positive for malaise/fatigue. Negative for chills and fever.  HENT: Negative for sore throat.   Eyes: Negative for blurred vision, double vision and pain.  Respiratory: Negative for cough, hemoptysis, shortness of breath and wheezing.   Cardiovascular: Negative for chest pain, palpitations, orthopnea and leg swelling.  Gastrointestinal: Positive for nausea. Negative for abdominal pain, constipation, diarrhea, heartburn and vomiting.  Genitourinary: Negative for dysuria and hematuria.  Musculoskeletal: Negative for back pain and joint pain.  Skin: Negative for rash.  Neurological: Negative for sensory change, speech change, focal weakness and headaches.  Endo/Heme/Allergies: Does not bruise/bleed easily.  Psychiatric/Behavioral: Negative for depression. The patient is not nervous/anxious.     DRUG ALLERGIES:   Allergies  Allergen Reactions  . Aspirin Other (See Comments)    Gi bleeding   . Vioxx [Rofecoxib] Other (See Comments)    Gi bleeding    VITALS:  Blood pressure (!) 153/68, pulse 86, temperature 97.9 F (36.6 C), temperature source Oral, resp. rate 17, height 5\' 6"  (1.676 m), weight 71.6 kg, SpO2 96 %.  PHYSICAL EXAMINATION:   Physical Exam  GENERAL:  58 y.o.-year-old patient lying in the bed with no acute distress.  EYES: Pupils equal, round, reactive to light and accommodation. No scleral icterus. Extraocular muscles intact.  HEENT: Head atraumatic, normocephalic. Oropharynx and nasopharynx clear.  NECK:  Supple, no jugular venous distention. No thyroid enlargement, no tenderness.   LUNGS: Normal breath sounds bilaterally, no wheezing, rales, rhonchi. No use of accessory muscles of respiration.  CARDIOVASCULAR: S1, S2 normal. No murmurs, rubs, or gallops.  ABDOMEN: Soft, nontender, nondistended. Bowel sounds present. No organomegaly or mass.  EXTREMITIES: No cyanosis, clubbing or edema b/l.    NEUROLOGIC: Cranial nerves II through XII are intact. No focal Motor or sensory deficits b/l.   PSYCHIATRIC: The patient is alert and oriented x 3.  SKIN: No obvious rash, lesion, or ulcer.   LABORATORY PANEL:   CBC Recent Labs  Lab 10/16/18 1424  WBC 9.9  HGB 17.5*  HCT 52.0*  PLT 313   ------------------------------------------------------------------------------------------------------------------ Chemistries  Recent Labs  Lab 10/16/18 1424  10/16/18 2229  10/17/18 0651  NA 134*   < > 140   < > 136  K 6.7*   < > 3.8   < > 3.7  CL 106   < > 122*   < > 115*  CO2 9*   < > 11*   < > 16*  GLUCOSE 497*   < > 191*   < > 228*  BUN 27*   < > 19   < > 18  CREATININE 1.08*   < > 0.55   < > 0.53  CALCIUM 9.1   < > 7.5*   < > 7.6*  MG  --   --  2.0  --   --   AST 25  --   --   --   --   ALT 30  --   --   --   --   ALKPHOS 79  --   --   --   --  BILITOT 1.7*  --   --   --   --    < > = values in this interval not displayed.   ------------------------------------------------------------------------------------------------------------------  Cardiac Enzymes Recent Labs  Lab 10/17/18 0651  TROPONINI <0.03   ------------------------------------------------------------------------------------------------------------------  RADIOLOGY:  Dg Abdomen Acute W/chest  Result Date: 10/16/2018 CLINICAL DATA:  Vomiting, nausea, syncopal episodes, symptoms for 3 days, history diabetes mellitus EXAM: DG ABDOMEN ACUTE W/ 1V CHEST COMPARISON:  None FINDINGS: Normal heart size, mediastinal contours, and pulmonary vascularity. Surgical clips at inferior mediastinum. Emphysematous  changes without pulmonary infiltrate, pleural effusion or pneumothorax. Bones demineralized. Bowel gas pattern normal. No bowel dilatation, bowel wall thickening, or free air. No urinary tract calcification. IMPRESSION: Emphysematous changes without infiltrate. No acute abdominal findings. Electronically Signed   By: Ulyses Southward M.D.   On: 10/16/2018 16:14     ASSESSMENT AND PLAN:   * DKA-improved.  Insulin drip stopped in ICU and transferred to medical floor.  Patient continues to have low bicarb.  Blood sugars have continued to be elevated.  I am concerned patient could go into DKA again.  Will start IV fluids.  Stat dose of additional Lantus.  Will repeat BMP.  Increase nighttime Lantus dose.  Sliding scale insulin.  *Nausea and vomiting.  Likely gastritis.  Repeat lipase level.  No further vomiting.  Improving.  Nausea persistent.  Add scheduled Reglan.  *Acute hyperkalemia Improved with IV insulin and treatment.  DVT prophylaxis with Lovenox  All the records are reviewed and case discussed with Care Management/Social Worker Management plans discussed with the patient, family and they are in agreement.  CODE STATUS: FULL CODE  DVT Prophylaxis: SCDs  TOTAL CRITICAL CARE TIME TAKING CARE OF THIS PATIENT: 35 minutes.   POSSIBLE D/C IN 1-2 DAYS, DEPENDING ON CLINICAL CONDITION.  Molinda Bailiff Santez Woodcox M.D on 10/17/2018 at 11:04 AM  Between 7am to 6pm - Pager - [phone]  After 6pm go to [email or web address] - password [email or web address] [facility]  [facility] [place] [facility]  Office  [phone]  CC: Primary care physician; Marguarite Arbour, MD  Note: This dictation was prepared with Dragon dictation along with smaller phrase technology. Any transcriptional errors that result from this process are unintentional.

## 2018-10-17 NOTE — Progress Notes (Signed)
Inpatient Diabetes Program Recommendations  AACE/ADA: New Consensus Statement on Inpatient Glycemic Control (2015)  Target Ranges:  Prepandial:   less than 140 mg/dL      Peak postprandial:   less than 180 mg/dL (1-2 hours)      Critically ill patients:  140 - 180 mg/dL   Lab Results  Component Value Date   GLUCAP 264 (H) 10/17/2018    Review of Glycemic Control  Diabetes history: DM2 Outpatient Diabetes medications: Lantus 28 units qd + Humalog correction scale 8-14 units tid with meals + Metformin 500 mg qd + Ozempic q week Current orders for Inpatient glycemic control: Lantus 25 units qd + Novolog moderate correction tid + hs  Inpatient Diabetes Program Recommendations:   Spoke with patient by phone and reviewed and clarified what patient is currently taking for her diabetes. Patient states she has been on the Ozempic x 1 month. Patient states she feels the Lantus is now helping her blood glucose very much and feels that it is causing her to gain weight. Reveiwed with patient Lantus does not cover meals but rather hrly insulin needs throughout the day. Consider Novolog 5 units tid meal coverage if patient eats 50% meals.  Thank you, Billy Fischer. Qusay Villada, RN, MSN, CDE  Diabetes Coordinator Inpatient Glycemic Control Team Team Pager [phone] (8am-5pm) 10/17/2018 3:27 PM

## 2018-10-17 NOTE — Progress Notes (Signed)
Report called to [employer] on 1A. Patient does not want husband called at this time to notify of transfer, will contact later in AM. Patient left floor with NT to room 146.

## 2018-10-18 LAB — BASIC METABOLIC PANEL
ANION GAP: 6 (ref 5–15)
BUN: 10 mg/dL (ref 6–20)
CO2: 22 mmol/L (ref 22–32)
Calcium: 8 mg/dL — ABNORMAL LOW (ref 8.9–10.3)
Chloride: 110 mmol/L (ref 98–111)
Creatinine, Ser: 0.41 mg/dL — ABNORMAL LOW (ref 0.44–1.00)
GFR calc non Af Amer: >60 mL/min (ref >60)
Glucose, Bld: 121 mg/dL — ABNORMAL HIGH (ref 70–99)
Potassium: 3.5 mmol/L (ref 3.5–5.1)
Sodium: 138 mmol/L (ref 135–145)

## 2018-10-18 LAB — GLUCOSE, CAPILLARY
Glucose-Capillary: 118 mg/dL — ABNORMAL HIGH (ref 70–99)
Glucose-Capillary: 262 mg/dL — ABNORMAL HIGH (ref 70–99)

## 2018-10-18 LAB — HIV ANTIBODY (ROUTINE TESTING W REFLEX): HIV Screen 4th Generation wRfx: NONREACTIVE

## 2018-10-18 NOTE — Plan of Care (Signed)
Patient is being discharge home as per order, no distress noted  Problem: Education: Goal: Ability to describe self-care measures that may prevent or decrease complications (Diabetes Survival Skills Education) will improve Outcome: Adequate for Discharge   Problem: Health Behavior/Discharge Planning: Goal: Ability to identify and utilize available resources and services will improve Outcome: Adequate for Discharge

## 2018-10-18 NOTE — Progress Notes (Signed)
Patient is being discharge home to self, patient presently resting in the bed, no distress noted, discharge instruction provided to patient , patient stated she will make her follow up appointment as soon as possible with her work schedule.

## 2018-10-18 NOTE — Discharge Summary (Signed)
[facility] - [place] at [facility]   PATIENT NAME: Judith Rogers    MR#:  [id number]  DATE OF BIRTH:  [birth year]  DATE OF ADMISSION:  10/16/2018 ADMITTING PHYSICIAN: Bertrum Sol, MD  DATE OF DISCHARGE: 10/18/2018  1:15 PM  PRIMARY CARE PHYSICIAN: Marguarite Arbour, MD   ADMISSION DIAGNOSIS:  Diabetic ketoacidosis without coma associated with other specified diabetes mellitus (HCC) [E13.10]  DISCHARGE DIAGNOSIS:  Active Problems:   DKA (diabetic ketoacidoses) (HCC)   Word finding difficulty   SECONDARY DIAGNOSIS:   Past Medical History:  Diagnosis Date  . Diabetes mellitus without complication (HCC)      ADMITTING HISTORY  HISTORY OF PRESENT ILLNESS: Judith Rogers  is a 58 y.o. female with a known history per below presents emergency room with 3-day history of worsening nausea, emesis, fatigue, generalized weakness, not able to eat over the last several days, in the emergency room patient was found to have potassium 6.7, glucose 497, anion gap 19, hemoglobin 17, bicarb 9, patient valuated in the emergency room, multiple family was present, patient states that she did not take her medications for her diabetes due to inability to tolerate p.o., patient is now admitted for acute diabetic ketoacidosis with hyperkalemia, dehydration, emesis.  HOSPITAL COURSE:   * DKA *dehydration *Hyperkalemia *Nausea and vomiting  Patient was admitted to stepdown unit on insulin drip, IV fluids and DKA protocol.  She improved well and DKA resolved quickly.  Was transferred to medical floor.  Lantus restarted along with sliding scale insulin.  Along with improving hyperglycemia her nausea and vomiting improved.  She likely had gastroenteritis which other family members had.  Afebrile.  Normal WBC.  By day of discharge her blood sugars are improved at 118.  Afebrile.  Electrolytes replaced and normal.  No further vomiting.  Tolerating diet.  Discharge home with unchanged insulin  doses to follow-up with her primary care physician. Work note given.  CONSULTS OBTAINED:    DRUG ALLERGIES:   Allergies  Allergen Reactions  . Aspirin Other (See Comments)    Gi bleeding   . Vioxx [Rofecoxib] Other (See Comments)    Gi bleeding    DISCHARGE MEDICATIONS:   Allergies as of 10/18/2018      Reactions   Aspirin Other (See Comments)   Gi bleeding    Vioxx [rofecoxib] Other (See Comments)   Gi bleeding      Medication List    TAKE these medications   enalapril 2.5 MG tablet Commonly known as:  VASOTEC Take 2.5 mg by mouth daily.   insulin glargine 100 UNIT/ML injection Commonly known as:  LANTUS Inject 28 Units into the skin daily.   insulin lispro 100 UNIT/ML KwikPen Commonly known as:  HUMALOG Inject 50 Units into the skin 2 (two) times daily.   LEVEMIR FLEXTOUCH 100 UNIT/ML Pen Generic drug:  Insulin Detemir Inject 28 Units into the skin Nightly.   metFORMIN 500 MG 24 hr tablet Commonly known as:  GLUCOPHAGE-XR Take 500 mg by mouth daily.   Semaglutide(0.25 or 0.5MG /DOS) 2 MG/1.5ML Sopn Inject 0.5 mg into the skin every 7 (seven) days.       Today   VITAL SIGNS:  Blood pressure (!) 142/84, pulse 82, temperature 98 F (36.7 C), temperature source Oral, resp. rate 17, height 5\' 6"  (1.676 m), weight 71.6 kg, SpO2 98 %.  I/O:    Intake/Output Summary (Last 24 hours) at 10/18/2018 1356 Last data filed at 10/18/2018 0900 Gross per 24 hour  Intake 1721.81 ml  Output -  Net 1721.81 ml    PHYSICAL EXAMINATION:  Physical Exam  GENERAL:  58 y.o.-year-old patient lying in the bed with no acute distress.  LUNGS: Normal breath sounds bilaterally, no wheezing, rales,rhonchi or crepitation. No use of accessory muscles of respiration.  CARDIOVASCULAR: S1, S2 normal. No murmurs, rubs, or gallops.  ABDOMEN: Soft, non-tender, non-distended. Bowel sounds present. No organomegaly or mass.  NEUROLOGIC: Moves all 4 extremities. PSYCHIATRIC: The  patient is alert and oriented x 3.  SKIN: No obvious rash, lesion, or ulcer.   DATA REVIEW:   CBC Recent Labs  Lab 10/17/18 1354  WBC 7.8  HGB 12.4  HCT 35.6*  PLT 231    Chemistries  Recent Labs  Lab 10/16/18 1424  10/16/18 2229  10/18/18 0813  NA 134*   < > 140   < > 138  K 6.7*   < > 3.8   < > 3.5  CL 106   < > 122*   < > 110  CO2 9*   < > 11*   < > 22  GLUCOSE 497*   < > 191*   < > 121*  BUN 27*   < > 19   < > 10  CREATININE 1.08*   < > 0.55   < > 0.41*  CALCIUM 9.1   < > 7.5*   < > 8.0*  MG  --   --  2.0  --   --   AST 25  --   --   --   --   ALT 30  --   --   --   --   ALKPHOS 79  --   --   --   --   BILITOT 1.7*  --   --   --   --    < > = values in this interval not displayed.    Cardiac Enzymes Recent Labs  Lab 10/17/18 0651  TROPONINI <0.03    Microbiology Results  Results for orders placed or performed during the hospital encounter of 10/16/18  MRSA PCR Screening     Status: None   Collection Time: 10/16/18  1:58 PM  Result Value Ref Range Status   MRSA by PCR NEGATIVE NEGATIVE Final    Comment:        The GeneXpert MRSA Assay (FDA approved for NASAL specimens only), is one component of a comprehensive MRSA colonization surveillance program. It is not intended to diagnose MRSA infection nor to guide or monitor treatment for MRSA infections. Performed at [facility], [address] [place], [place] [address]     RADIOLOGY:  Dg Abdomen Acute W/chest  Result Date: 10/16/2018 CLINICAL DATA:  Vomiting, nausea, syncopal episodes, symptoms for 3 days, history diabetes mellitus EXAM: DG ABDOMEN ACUTE W/ 1V CHEST COMPARISON:  None FINDINGS: Normal heart size, mediastinal contours, and pulmonary vascularity. Surgical clips at inferior mediastinum. Emphysematous changes without pulmonary infiltrate, pleural effusion or pneumothorax. Bones demineralized. Bowel gas pattern normal. No bowel dilatation, bowel wall thickening, or free air. No  urinary tract calcification. IMPRESSION: Emphysematous changes without infiltrate. No acute abdominal findings. Electronically Signed   By: Ulyses SouthwardMark  Boles M.D.   On: 10/16/2018 16:14    Follow up with PCP in 1 week.  Management plans discussed with the patient, family and they are in agreement.  CODE STATUS:     Code Status Orders  (From admission, onward)  Start     Ordered   10/16/18 1847  Full code  Continuous     10/16/18 1847        Code Status History    This patient has a current code status but no historical code status.      TOTAL TIME TAKING CARE OF THIS PATIENT ON DAY OF DISCHARGE: more than 30 minutes.   Molinda Bailiff Jonavin Seder M.D on 10/18/2018 at 1:56 PM  Between 7am to 6pm - Pager - [phone]  After 6pm go to [email or web address] - password [email or web address] [facility]  [facility] [place] [facility]  Office  [phone]  CC: Primary care physician; Marguarite Arbour, MD  Note: This dictation was prepared with Dragon dictation along with smaller phrase technology. Any transcriptional errors that result from this process are unintentional.

## 2018-10-19 LAB — HEMOGLOBIN A1C
Hgb A1c MFr Bld: 8.8 % — ABNORMAL HIGH (ref 4.8–5.6)
Mean Plasma Glucose: 206 mg/dL

## 2018-12-05 DIAGNOSIS — Z794 Long term (current) use of insulin: Secondary | ICD-10-CM | POA: Insufficient documentation

## 2018-12-05 DIAGNOSIS — E119 Type 2 diabetes mellitus without complications: Secondary | ICD-10-CM | POA: Insufficient documentation

## 2020-05-24 ENCOUNTER — Other Ambulatory Visit: Payer: Self-pay | Admitting: Orthopedic Surgery

## 2020-05-24 DIAGNOSIS — M25531 Pain in right wrist: Secondary | ICD-10-CM

## 2020-05-24 DIAGNOSIS — M25331 Other instability, right wrist: Secondary | ICD-10-CM

## 2020-06-16 ENCOUNTER — Other Ambulatory Visit: Payer: Self-pay

## 2020-06-16 ENCOUNTER — Ambulatory Visit
Admission: RE | Admit: 2020-06-16 | Discharge: 2020-06-16 | Disposition: A | Discharge status: Home / Self Care | Payer: Managed Care, Other (non HMO) | Source: Ambulatory Visit | Attending: Orthopedic Surgery | Admitting: Orthopedic Surgery

## 2020-06-16 DIAGNOSIS — M25331 Other instability, right wrist: Secondary | ICD-10-CM

## 2020-06-16 DIAGNOSIS — M25531 Pain in right wrist: Secondary | ICD-10-CM

## 2020-08-15 ENCOUNTER — Ambulatory Visit (INDEPENDENT_AMBULATORY_CARE_PROVIDER_SITE_OTHER): Payer: Managed Care, Other (non HMO) | Admitting: Obstetrics

## 2020-08-15 ENCOUNTER — Encounter: Payer: Self-pay | Admitting: Obstetrics

## 2020-08-15 ENCOUNTER — Other Ambulatory Visit: Payer: Self-pay

## 2020-08-15 VITALS — BP 154/80 | HR 84 | Ht 65.5 in | Wt 166.0 lb

## 2020-08-15 DIAGNOSIS — Z1231 Encounter for screening mammogram for malignant neoplasm of breast: Secondary | ICD-10-CM | POA: Diagnosis not present

## 2020-08-15 DIAGNOSIS — Z01419 Encounter for gynecological examination (general) (routine) without abnormal findings: Secondary | ICD-10-CM

## 2020-08-15 DIAGNOSIS — Z124 Encounter for screening for malignant neoplasm of cervix: Secondary | ICD-10-CM

## 2020-08-15 NOTE — Progress Notes (Signed)
Routine Annual Gynecology Examination   PCP: Marguarite Arbour, MD  Chief Complaint:  Chief Complaint  Patient presents with  . Gynecologic Exam    History of Present Illness: Patient is a 59 y.o. No obstetric history on file. presents for annual exam. The patient has no complaints today.   Menopausal bleeding: denies  Menopausal symptoms: reports  Some occasional vaginal dryness Breast symptoms: denies  Last pap smear: "years" ago.  Result Normal, but she reports a hx of abnormal pap with colpo and "freezing" many years ago.  Last mammogram: She cannot recall- also many  years ago.  Result no report available/she reports it was normal.  Past Medical History:  Diagnosis Date  . Diabetes mellitus without complication (HCC)     No past surgical history on file.  Prior to Admission medications   Medication Sig Start Date End Date Taking? Authorizing Provider  enalapril (VASOTEC) 2.5 MG tablet Take 2.5 mg by mouth daily. 05/01/17  Yes [provider]  glucose blood (ONETOUCH ULTRA) test strip 1 each by Other route in the morning, at noon, and at bedtime. 07/11/19  Yes [provider]  insulin detemir (LEVEMIR) 100 UNIT/ML FlexPen Inject 28 Units into the skin Nightly. 04/03/17  Yes [provider]  insulin glargine (LANTUS) 100 UNIT/ML injection Inject 28 Units into the skin daily.   Yes [provider]  insulin lispro (HUMALOG) 100 UNIT/ML KwikPen Inject 50 Units into the skin 2 (two) times daily. 12/19/16  Yes [provider]  metFORMIN (GLUCOPHAGE-XR) 500 MG 24 hr tablet Take 500 mg by mouth daily. 09/02/18  Yes [provider]  Semaglutide,0.25 or 0.5MG /DOS, 2 MG/1.5ML SOPN Inject 0.5 mg into the skin every 7 (seven) days. 09/02/18  Yes [provider]    Allergies  Allergen Reactions  . Aspirin Other (See Comments)    Gi bleeding   . Vioxx [Rofecoxib] Other (See Comments)    Gi bleeding    Gynecologic History:   No LMP recorded. Patient is postmenopausal. Contraception: post menopausal status Last Pap: unknown. Results were: normal Last mammogram: unknow date. Results were: ?  Obstetric History: No obstetric history on file.  Social History   Socioeconomic History  . Marital status: Married    Spouse name: Not on file  . Number of children: Not on file  . Years of education: Not on file  . Highest education level: Not on file  Occupational History  . Not on file  Tobacco Use  . Smoking status: Never Smoker  . Smokeless tobacco: Never Used  Vaping Use  . Vaping Use: Never used  Substance and Sexual Activity  . Alcohol use: Never  . Drug use: Never  . Sexual activity: Yes    Birth control/protection: Post-menopausal  Other Topics Concern  . Not on file  Social History Narrative  . Not on file   Social Determinants of Health   Financial Resource Strain: Not on file  Food Insecurity: Not on file  Transportation Needs: Not on file  Physical Activity: Not on file  Stress: Not on file  Social Connections: Not on file  Intimate Partner Violence: Not on file    No family history on file.  Review of Systems  Constitutional: Negative.   Respiratory: Negative.   Cardiovascular: Negative.   Gastrointestinal: Negative.   Musculoskeletal: Negative.   Skin: Negative.   Endo/Heme/Allergies: Negative.      Physical Exam Vitals: BP (!) 154/80   Pulse 84   Ht  5' 5.5" (1.664 m)   Wt 166 lb (75.3 kg)   BMI 27.20 kg/m   Physical Exam Constitutional:      Appearance: Normal appearance. She is normal weight.  Genitourinary:     Vulva normal.     Genitourinary Comments: Some labial atrophy noted, no rashes, lesions or discharge. Uterus is anteverted, non enlarged. No vaginal discharge noted. Vaginal walls smooth. No adnexal masses or enlargement.  HENT:     Head: Normocephalic and atraumatic.     Nose: Nose normal.  Eyes:     Extraocular Movements: Extraocular movements  intact.     Pupils: Pupils are equal, round, and reactive to light.  Cardiovascular:     Rate and Rhythm: Normal rate and regular rhythm.     Pulses: Normal pulses.     Heart sounds: Normal heart sounds.  Pulmonary:     Effort: Pulmonary effort is normal.     Breath sounds: Normal breath sounds.  Abdominal:     General: Bowel sounds are normal.     Palpations: Abdomen is soft.  Musculoskeletal:        General: Normal range of motion.     Cervical back: Normal range of motion and neck supple.  Neurological:     Mental Status: She is alert and oriented to person, place, and time.  Skin:    General: Skin is warm and dry.  Psychiatric:        Mood and Affect: Mood normal.        Behavior: Behavior normal.      Female chaperone present for pelvic and breast  portions of the physical exam       Assessment and Plan:  59 y.o. No obstetric history on file. female here for routine annual gynecologic examination  Plan: Problem List Items Addressed This Visit   None   Visit Diagnoses    Women's annual routine gynecological examination    -  Primary   Relevant Orders   IGP,CtNg,AptimaHPV,rfx16/18,45   MM DIGITAL SCREENING BILATERAL   Cervical cancer screening       Relevant Orders   IGP,CtNg,AptimaHPV,rfx16/18,45   Breast cancer screening by mammogram       Relevant Orders   MM DIGITAL SCREENING BILATERAL      Screening: -- Blood pressure screen managed by PCP -- Colonoscopy - not due -- Mammogram - due. Patient to call [place] to arrange. She understands that it is her responsibility to arrange this. Order placed -- Weight screening: normal -- Depression screening negative (PHQ-9) -- Nutrition: normal -- cholesterol screening: per PCP -- osteoporosis screening: not due -- tobacco screening: not using -- alcohol screening: AUDIT questionnaire indicates low-risk usage. -- family history of breast cancer screening: done. not at high risk. -- no evidence of domestic  violence or intimate partner violence. -- STD screening: gonorrhea/chlamydia NAAT not collected per patient request. -- pap smear collected per ASCCP guidelines -- flu vaccine per PCP -- HPV vaccination series: not eligilbe  RTC in one year.   Mirna Mires, CNM  08/15/2020 5:48 PM      08/15/2020 5:30 PM

## 2020-08-27 LAB — IGP,CTNG,APTIMAHPV,RFX16/18,45
Chlamydia, Nuc. Acid Amp: NEGATIVE
Gonococcus by Nucleic Acid Amp: NEGATIVE
HPV Aptima: NEGATIVE

## 2020-08-28 ENCOUNTER — Encounter: Payer: Self-pay | Admitting: Obstetrics

## 2020-08-28 NOTE — Progress Notes (Signed)
Normal pap smear. Attempted to call the patient at her two listed phone numbers. One number is not is service, and left a VM for patient to call with questions on the other. Mentioned only that I was calling to convey lab results. Mirna Mires, CNM  08/28/2020 2:13 PM

## 2021-06-18 ENCOUNTER — Other Ambulatory Visit: Payer: Self-pay | Admitting: Obstetrics

## 2021-06-18 DIAGNOSIS — Z1231 Encounter for screening mammogram for malignant neoplasm of breast: Secondary | ICD-10-CM

## 2021-07-29 ENCOUNTER — Other Ambulatory Visit: Payer: Self-pay

## 2021-07-29 ENCOUNTER — Ambulatory Visit
Admission: RE | Admit: 2021-07-29 | Discharge: 2021-07-29 | Disposition: A | Discharge status: Home / Self Care | Payer: Managed Care, Other (non HMO) | Source: Ambulatory Visit | Attending: Obstetrics | Admitting: Obstetrics

## 2021-07-29 DIAGNOSIS — Z1231 Encounter for screening mammogram for malignant neoplasm of breast: Secondary | ICD-10-CM | POA: Diagnosis not present

## 2021-08-16 ENCOUNTER — Ambulatory Visit: Payer: Managed Care, Other (non HMO) | Admitting: Obstetrics

## 2021-09-09 ENCOUNTER — Encounter: Payer: Self-pay | Admitting: Obstetrics

## 2021-09-09 ENCOUNTER — Other Ambulatory Visit: Payer: Self-pay

## 2021-09-09 ENCOUNTER — Ambulatory Visit (INDEPENDENT_AMBULATORY_CARE_PROVIDER_SITE_OTHER): Payer: Managed Care, Other (non HMO) | Admitting: Obstetrics

## 2021-09-09 VITALS — BP 122/74 | Ht 65.0 in | Wt 163.0 lb

## 2021-09-09 DIAGNOSIS — Z01419 Encounter for gynecological examination (general) (routine) without abnormal findings: Secondary | ICD-10-CM

## 2021-09-09 NOTE — Progress Notes (Signed)
Gynecology Annual Exam  PCP: Marguarite Arbour, MD  Chief Complaint:  Chief Complaint  Patient presents with   Annual Exam    History of Present Illness:Patient is a 61 y.o. No obstetric history on file. presents for annual exam. The patient has no complaints today. She is not due for a pap smear. Her last pap was NILM. She recently had a mammogram , so is not due for 11 months.  Tamicka is married with several children. She is an insulin dependent diabetic. She works for [employer] and has a virtual position there.  LMP: No LMP recorded. Patient is postmenopausal. The patient is sexually active. She denies dyspareunia.  The patient does perform self breast exams.  There is no notable family history of breast or ovarian cancer in her family.  The patient wears seatbelts: yes.   The patient has regular exercise: yes.    The patient denies current symptoms of depression.     Review of Systems: ROS  Past Medical History:  Patient Active Problem List   Diagnosis Date Noted   DKA (diabetic ketoacidoses) 10/16/2018   Word finding difficulty 10/16/2018    Past Surgical History:  Past Surgical History:  Procedure Laterality Date   CARDIAC SURGERY     KIDNEY STONE SURGERY      Gynecologic History:  No LMP recorded. Patient is postmenopausal. Last Pap:  in 2021Results were:  no abnormalities  Last mammogram: 11/;2022 Results were: BI-RAD I  Obstetric History: No obstetric history on file.  Family History:  Family History  Problem Relation Age of Onset   Breast cancer Neg Hx     Social History:  Social History   Socioeconomic History   Marital status: Married    Spouse name: Not on file   Number of children: Not on file   Years of education: Not on file   Highest education level: Not on file  Occupational History   Not on file  Tobacco Use   Smoking status: Never   Smokeless tobacco: Never  Vaping Use   Vaping Use: Never used  Substance and Sexual Activity    Alcohol use: Never   Drug use: Never   Sexual activity: Yes    Birth control/protection: Post-menopausal  Other Topics Concern   Not on file  Social History Narrative   Not on file   Social Determinants of Health   Financial Resource Strain: Not on file  Food Insecurity: Not on file  Transportation Needs: Not on file  Physical Activity: Not on file  Stress: Not on file  Social Connections: Not on file  Intimate Partner Violence: Not on file    Allergies:  Allergies  Allergen Reactions   Aspirin Other (See Comments)    Gi bleeding    Vioxx [Rofecoxib] Other (See Comments)    Gi bleeding    Medications: Prior to Admission medications   Medication Sig Start Date End Date Taking? Authorizing Provider  atorvastatin (LIPITOR) 10 MG tablet Take 1 tablet by mouth daily. 11/09/20 11/09/21 Yes [provider]  atorvastatin (LIPITOR) 10 MG tablet Take 10 mg by mouth daily. 06/12/21  Yes [provider]  enalapril (VASOTEC) 2.5 MG tablet Take 2.5 mg by mouth daily. 05/01/17  Yes [provider]  glucose blood (ONETOUCH ULTRA) test strip 1 each by Other route in the morning, at noon, and at bedtime. 07/11/19  Yes [provider]  insulin detemir (LEVEMIR) 100 UNIT/ML FlexPen Inject 28 Units into the skin  Nightly. 04/03/17  Yes [provider]  insulin glargine (LANTUS) 100 UNIT/ML injection Inject 28 Units into the skin daily.   Yes [provider]  insulin lispro (HUMALOG) 100 UNIT/ML KwikPen Inject 50 Units into the skin 2 (two) times daily. 12/19/16  Yes [provider]  metFORMIN (GLUCOPHAGE-XR) 500 MG 24 hr tablet Take 500 mg by mouth daily. 09/02/18  Yes [provider]  Semaglutide,0.25 or 0.5MG /DOS, 2 MG/1.5ML SOPN Inject 0.5 mg into the skin every 7 (seven) days. 09/02/18  Yes [provider]  OZEMPIC, 1 MG/DOSE, 4 MG/3ML SOPN Inject into the skin. 07/24/21   [provider]    Physical  Exam Vitals: Blood pressure 122/74, height 5\' 5"  (1.651 m), weight 163 lb (73.9 kg).  General: NAD HEENT: normocephalic, anicteric Thyroid: no enlargement, no palpable nodules Pulmonary: No increased work of breathing, CTAB Cardiovascular: RRR, distal pulses 2+ Breast: Breast symmetrical, no tenderness, no palpable nodules or masses, no skin or nipple retraction present, no nipple discharge.  No axillary or supraclavicular lymphadenopathy. Abdomen: NABS, soft, non-tender, non-distended.  Umbilicus without lesions.  No hepatomegaly, splenomegaly or masses palpable. No evidence of hernia  Genitourinary:  External: Normal external female genitalia.  Normal urethral meatus, normal Bartholin's and Skene's glands.    Vagina: Normal vaginal mucosa, no evidence of prolapse.    Cervix: Grossly normal in appearance, no bleeding  Uterus: Non-enlarged, mobile, normal contour.  No CMT  Adnexa: ovaries non-enlarged, no adnexal masses  Rectal: deferred  Lymphatic: no evidence of inguinal lymphadenopathy Extremities: no edema, erythema, or tenderness Neurologic: Grossly intact Psychiatric: mood appropriate, affect full  Female chaperone present for pelvic and breast  portions of the physical exam     Assessment: 61 y.o. No obstetric history on file. routine annual exam  Plan: Problem List Items Addressed This Visit   None Visit Diagnoses     Women's annual routine gynecological examination    -  Primary       1) Mammogram - recommend yearly screening mammogram.  Mammogram Is up to date  2) STI screening  wasoffered and declined  3) ASCCP guidelines and rational discussed.  Patient opts for every 5 years screening interval  4) Osteoporosis  - per USPTF routine screening DEXA at age 78 She has never had a DEXA scan, and this is recommended today  5) Routine healthcare maintenance including cholesterol, diabetes screening discussed managed by PCP  6) Colonoscopy She has had cologard.   Screening recommended starting at age 60 for average risk individuals, age 27 for individuals deemed at increased risk (including African Americans) and recommended to continue until age 22.  For patient age 8-85 individualized approach is recommended.  Gold standard screening is via colonoscopy, Cologuard screening is an acceptable alternative for patient unwilling or unable to undergo colonoscopy.  "Colorectal cancer screening for average?risk adults: 2018 guideline update from the American Cancer Society"[place]: A Cancer Journal for Clinicians: Jan 28, 2017   7) Return in about 1 year (around 09/09/2022) for annual.    11/08/2022, CNM  09/09/2021 1:41 PM   11/07/2021, [facility] 09/09/2021, 1:41 PM

## 2021-11-01 IMAGING — MR MR WRIST*R* W/O CM
7 series · 40 of 40 positions shown · non-contrast
Comparison: None.

CLINICAL DATA: Radial sided wrist pain since [REDACTED]. History of prior
tendon repair 10 years ago.

EXAM:
MR OF THE RIGHT WRIST WITHOUT CONTRAST
TECHNIQUE: Multiplanar, multisequence MR imaging of the right wrist was
performed. No intravenous contrast was administered.

[Series 3: T2 fat-sat · axial · right · 3.0mm · 0.34mm/px · z∈[-19,+59]mm · 7 of 25 slices shown (1 of 3)]
[im 1/25]
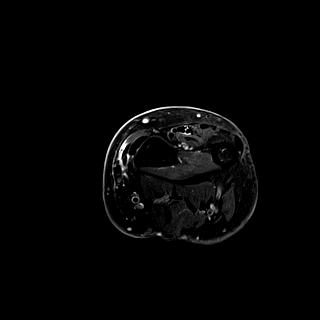
[im 5/25]
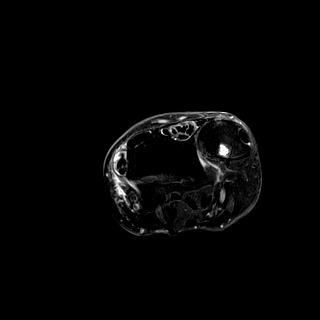
[im 9/25]
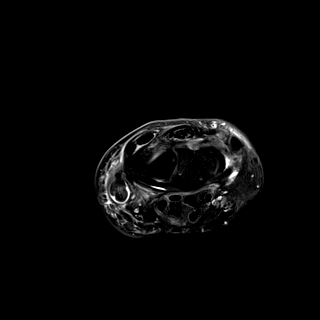
[im 13/25]
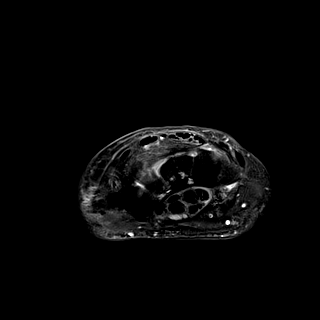
[im 17/25]
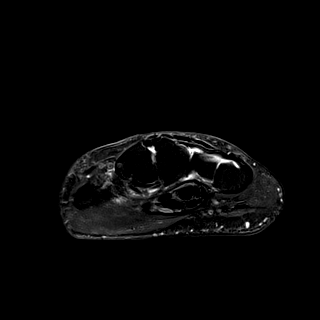
[im 21/25]
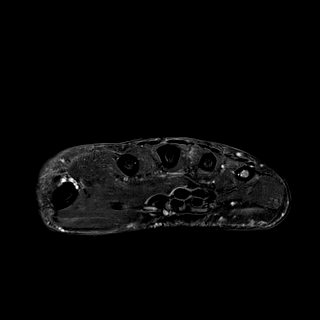
[im 25/25]
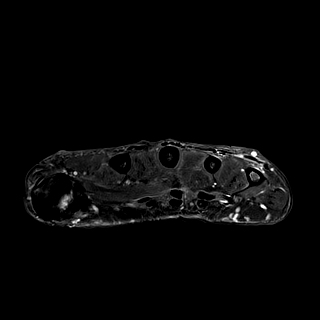

[Series 4: T1 · axial · right · 3.0mm · 0.43mm/px · z∈[-19,+59]mm · 7 of 25 slices shown (1 of 2)]
[im 1/25]
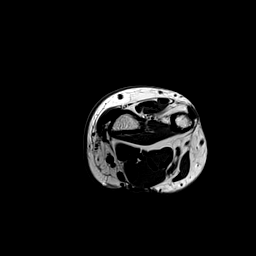
[im 5/25]
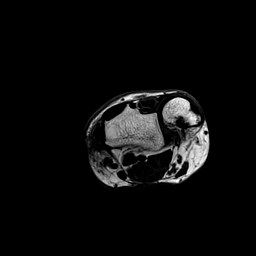
[im 9/25]
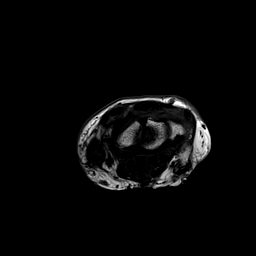
[im 13/25]
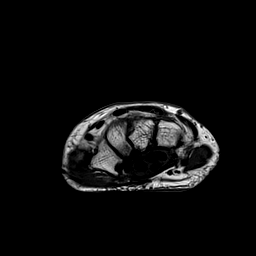
[im 17/25]
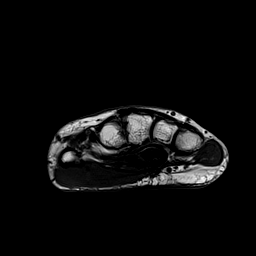
[im 21/25]
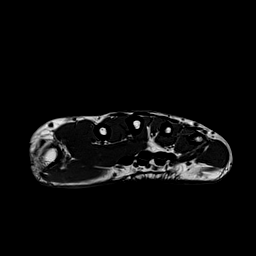
[im 25/25]
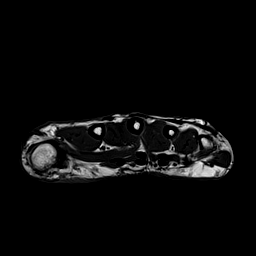

[Series 5: T1 · coronal · right · 3.0mm · 0.26mm/px · 5 of 17 slices shown (2 of 2)]
[im 1/17]
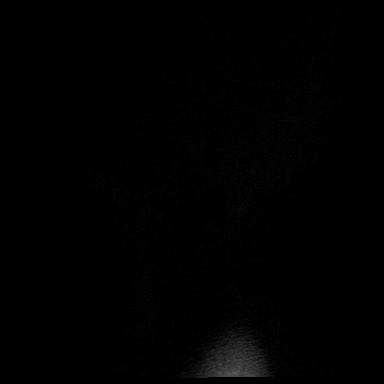
[im 5/17]
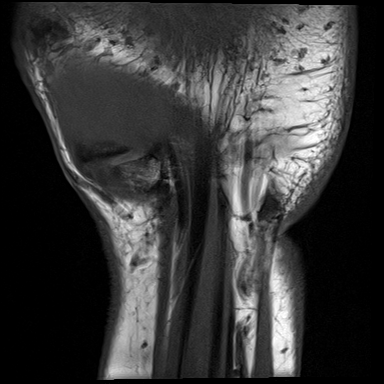
[im 9/17]
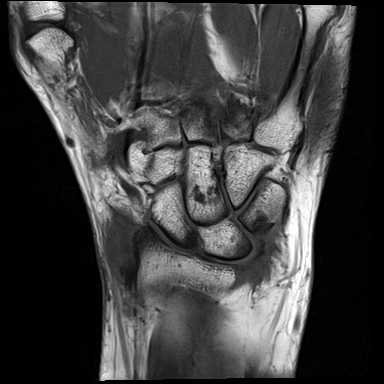
[im 13/17]
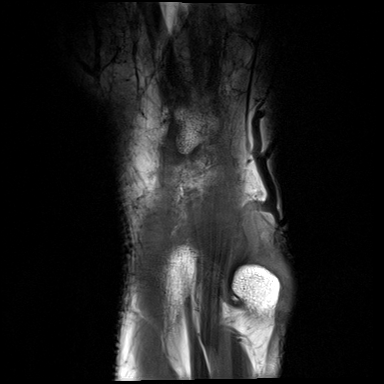
[im 17/17]
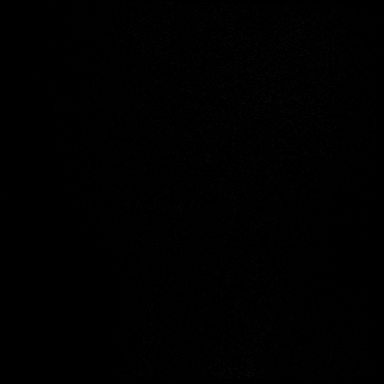

[Series 6: T2 fat-sat · coronal · right · 3.0mm · 0.31mm/px · 4 of 17 slices shown (2 of 3)]
[im 1/17]
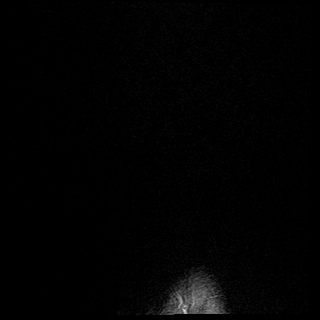
[im 6/17]
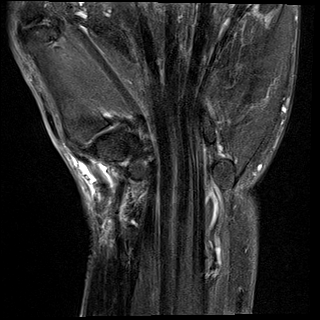
[im 11/17]
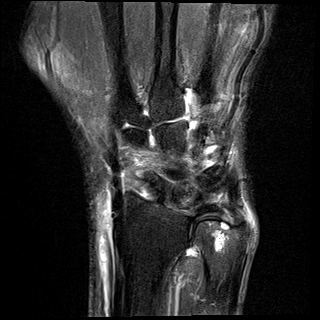
[im 17/17]
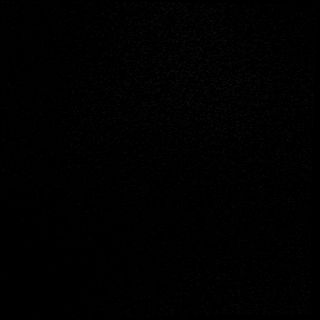

[Series 7: PD fat-sat · coronal · right · 3.0mm · 0.31mm/px · 4 of 17 slices shown (1 of 2)]
[im 1/17]
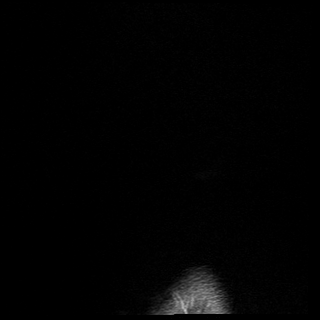
[im 6/17]
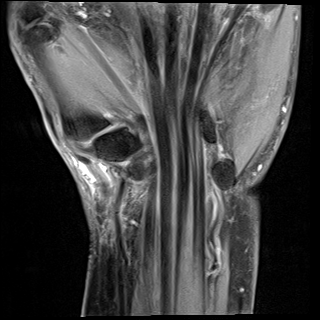
[im 11/17]
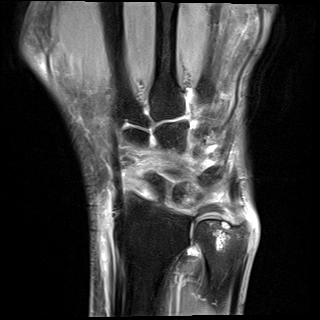
[im 17/17]
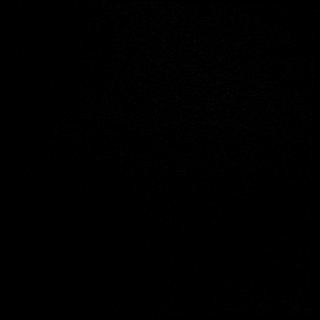

[Series 8: PD fat-sat · sagittal · right · 3.0mm · 0.31mm/px · 7 of 29 slices shown (2 of 2)]
[im 1/29]
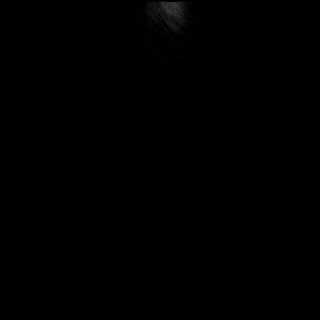
[im 5/29]
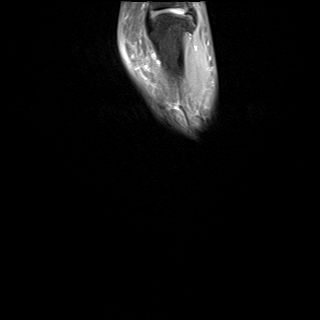
[im 10/29]
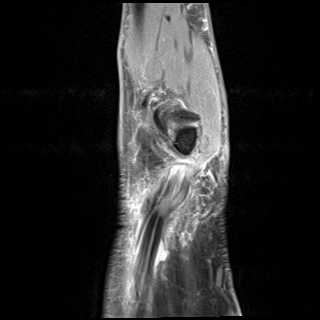
[im 15/29]
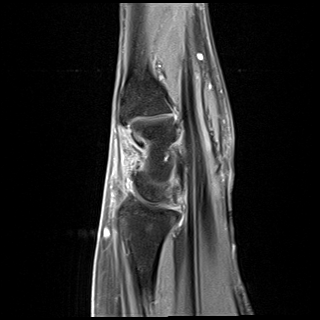
[im 19/29]
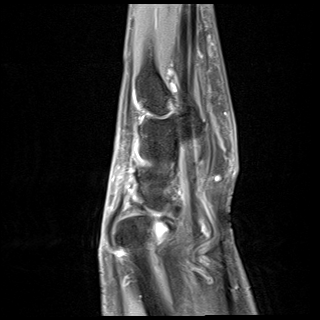
[im 24/29]
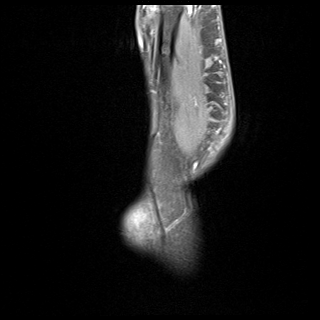
[im 29/29]
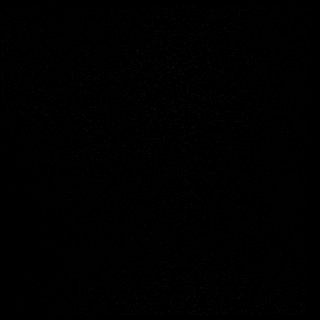

[Series 9: T2 fat-sat · axial · right · 3.0mm · 0.43mm/px · z∈[-19,+59]mm · 6 of 25 slices shown (3 of 3)]
[im 1/25]
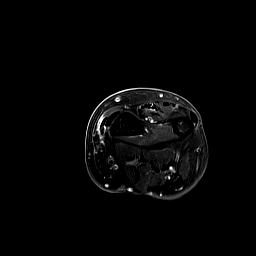
[im 5/25]
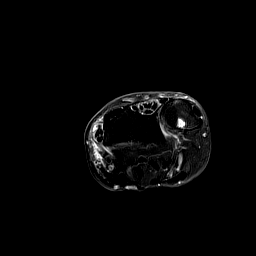
[im 10/25]
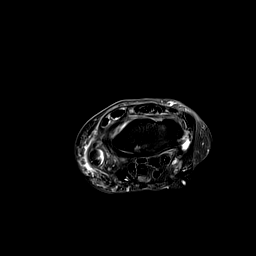
[im 15/25]
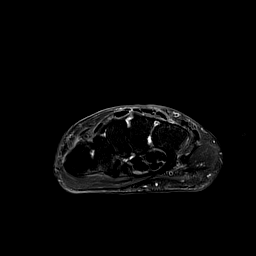
[im 20/25]
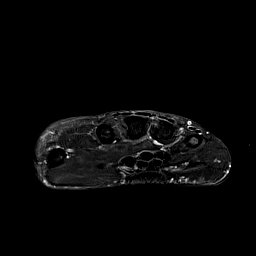
[im 25/25]
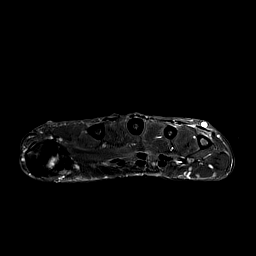

[40 of 40 positions shown; findings below may reference images not displayed]

FINDINGS: Ligaments: Intact scapholunate and lunotriquetral ligaments.

Triangular fibrocartilage: Intact TFCC.

Tendons: Severe abductor pollicis longus and extensor pollicis
brevis tendinosis with fluid in the tendon sheath. Small partial
tear of the extensor pollicis brevis tendon. Remaining flexor and
extensor tendons are unremarkable.

Carpal tunnel/median nerve: Normal carpal tunnel. Normal median
nerve.

Guyon's canal: Normal.

Joint/cartilage: No joint effusion. No chondral defect.

Bones/carpal alignment: Reactive cystic change in the distal ulna.
Small cysts in the capitate and distal scaphoid. No suspicious bone
lesion. No acute fracture or dislocation.

Other: Soft tissue swelling along the radial aspect of the wrist
overlying the first extensor tendon compartment.
IMPRESSION: 1. Severe de Quervain tenosynovitis. Small partial tear of the
extensor pollicis brevis tendon.

## 2022-07-28 ENCOUNTER — Other Ambulatory Visit: Payer: Self-pay | Admitting: Obstetrics

## 2022-07-28 DIAGNOSIS — Z1231 Encounter for screening mammogram for malignant neoplasm of breast: Secondary | ICD-10-CM

## 2022-08-07 ENCOUNTER — Ambulatory Visit
Admission: RE | Admit: 2022-08-07 | Discharge: 2022-08-07 | Disposition: A | Discharge status: Home / Self Care | Payer: Managed Care, Other (non HMO) | Source: Ambulatory Visit | Attending: Obstetrics | Admitting: Obstetrics

## 2022-08-07 DIAGNOSIS — Z1231 Encounter for screening mammogram for malignant neoplasm of breast: Secondary | ICD-10-CM | POA: Diagnosis present

## 2023-07-20 ENCOUNTER — Encounter: Payer: Self-pay | Admitting: Internal Medicine

## 2023-07-21 ENCOUNTER — Other Ambulatory Visit: Payer: Self-pay | Admitting: Obstetrics

## 2023-07-21 DIAGNOSIS — Z1231 Encounter for screening mammogram for malignant neoplasm of breast: Secondary | ICD-10-CM

## 2023-08-19 ENCOUNTER — Ambulatory Visit
Admission: RE | Admit: 2023-08-19 | Discharge: 2023-08-19 | Disposition: A | Discharge status: Home / Self Care | Payer: Managed Care, Other (non HMO) | Source: Ambulatory Visit | Attending: Obstetrics | Admitting: Obstetrics

## 2023-08-19 DIAGNOSIS — Z1231 Encounter for screening mammogram for malignant neoplasm of breast: Secondary | ICD-10-CM | POA: Insufficient documentation
